# Patient Record
Sex: Female | Born: 1935
Health system: Southern US, Community
[De-identification: ages and names within clinical notes are randomized; demographics above are authoritative.]

## PROBLEM LIST (undated history)

## (undated) DIAGNOSIS — E785 Hyperlipidemia, unspecified: Secondary | ICD-10-CM

## (undated) DIAGNOSIS — M1990 Unspecified osteoarthritis, unspecified site: Secondary | ICD-10-CM

## (undated) DIAGNOSIS — Z972 Presence of dental prosthetic device (complete) (partial): Secondary | ICD-10-CM

## (undated) DIAGNOSIS — E119 Type 2 diabetes mellitus without complications: Secondary | ICD-10-CM

## (undated) DIAGNOSIS — K219 Gastro-esophageal reflux disease without esophagitis: Secondary | ICD-10-CM

## (undated) DIAGNOSIS — Z973 Presence of spectacles and contact lenses: Secondary | ICD-10-CM

## (undated) DIAGNOSIS — I10 Essential (primary) hypertension: Secondary | ICD-10-CM

## (undated) HISTORY — PX: ERCP: SHX60

## (undated) HISTORY — PX: HERNIA REPAIR: SHX51

## (undated) HISTORY — PX: CYST REMOVAL LEG: SHX6280

## (undated) HISTORY — PX: FLEXIBLE SIGMOIDOSCOPY: SHX1649

## (undated) HISTORY — PX: COLONOSCOPY: SHX174

## (undated) HISTORY — PX: TONSILLECTOMY: SUR1361

## (undated) HISTORY — PX: SHOULDER ARTHROSCOPY: SHX128

## (undated) HISTORY — PX: APPENDECTOMY: SHX54

---

## 1972-08-27 HISTORY — PX: ABDOMINAL HYSTERECTOMY: SHX81

## 2001-04-09 ENCOUNTER — Ambulatory Visit (HOSPITAL_COMMUNITY): Admission: RE | Admit: 2001-04-09 | Discharge: 2001-04-09 | Payer: Self-pay | Admitting: Internal Medicine

## 2001-04-09 ENCOUNTER — Encounter: Payer: Self-pay | Admitting: Internal Medicine

## 2001-08-27 HISTORY — PX: DIAGNOSTIC LAPAROSCOPY: SUR761

## 2001-10-15 ENCOUNTER — Ambulatory Visit (HOSPITAL_COMMUNITY): Admission: RE | Admit: 2001-10-15 | Discharge: 2001-10-15 | Payer: Self-pay | Admitting: Internal Medicine

## 2001-10-15 ENCOUNTER — Encounter: Payer: Self-pay | Admitting: Internal Medicine

## 2001-12-26 ENCOUNTER — Ambulatory Visit (HOSPITAL_COMMUNITY): Admission: RE | Admit: 2001-12-26 | Discharge: 2001-12-26 | Payer: Self-pay | Admitting: Internal Medicine

## 2001-12-26 ENCOUNTER — Encounter: Payer: Self-pay | Admitting: Internal Medicine

## 2002-01-22 ENCOUNTER — Ambulatory Visit (HOSPITAL_COMMUNITY): Admission: RE | Admit: 2002-01-22 | Discharge: 2002-01-22 | Payer: Self-pay | Admitting: Obstetrics & Gynecology

## 2002-05-21 ENCOUNTER — Emergency Department (HOSPITAL_COMMUNITY): Admission: EM | Admit: 2002-05-21 | Discharge: 2002-05-21 | Payer: Self-pay | Admitting: *Deleted

## 2002-05-21 ENCOUNTER — Encounter: Payer: Self-pay | Admitting: *Deleted

## 2002-10-19 ENCOUNTER — Encounter: Payer: Self-pay | Admitting: Internal Medicine

## 2002-10-19 ENCOUNTER — Ambulatory Visit (HOSPITAL_COMMUNITY): Admission: RE | Admit: 2002-10-19 | Discharge: 2002-10-19 | Payer: Self-pay | Admitting: Internal Medicine

## 2002-11-16 ENCOUNTER — Encounter: Payer: Self-pay | Admitting: Internal Medicine

## 2002-11-16 ENCOUNTER — Ambulatory Visit (HOSPITAL_COMMUNITY): Admission: RE | Admit: 2002-11-16 | Discharge: 2002-11-16 | Payer: Self-pay | Admitting: Internal Medicine

## 2002-12-30 ENCOUNTER — Ambulatory Visit (HOSPITAL_COMMUNITY): Admission: RE | Admit: 2002-12-30 | Discharge: 2002-12-30 | Payer: Self-pay | Admitting: Internal Medicine

## 2002-12-30 ENCOUNTER — Encounter: Payer: Self-pay | Admitting: Internal Medicine

## 2003-09-30 ENCOUNTER — Ambulatory Visit (HOSPITAL_COMMUNITY): Admission: RE | Admit: 2003-09-30 | Discharge: 2003-09-30 | Payer: Self-pay | Admitting: Internal Medicine

## 2003-10-25 ENCOUNTER — Ambulatory Visit (HOSPITAL_COMMUNITY): Admission: RE | Admit: 2003-10-25 | Discharge: 2003-10-25 | Payer: Self-pay | Admitting: Internal Medicine

## 2003-11-10 ENCOUNTER — Ambulatory Visit (HOSPITAL_COMMUNITY): Admission: RE | Admit: 2003-11-10 | Discharge: 2003-11-10 | Payer: Self-pay | Admitting: Internal Medicine

## 2004-06-06 ENCOUNTER — Emergency Department (HOSPITAL_COMMUNITY): Admission: EM | Admit: 2004-06-06 | Discharge: 2004-06-07 | Payer: Self-pay | Admitting: *Deleted

## 2004-11-17 ENCOUNTER — Ambulatory Visit (HOSPITAL_COMMUNITY): Admission: RE | Admit: 2004-11-17 | Discharge: 2004-11-17 | Payer: Self-pay | Admitting: Internal Medicine

## 2005-11-28 ENCOUNTER — Ambulatory Visit (HOSPITAL_COMMUNITY): Admission: RE | Admit: 2005-11-28 | Discharge: 2005-11-28 | Payer: Self-pay | Admitting: Internal Medicine

## 2006-03-24 ENCOUNTER — Emergency Department (HOSPITAL_COMMUNITY): Admission: EM | Admit: 2006-03-24 | Discharge: 2006-03-24 | Payer: Self-pay | Admitting: Emergency Medicine

## 2006-12-19 ENCOUNTER — Ambulatory Visit (HOSPITAL_COMMUNITY): Admission: RE | Admit: 2006-12-19 | Discharge: 2006-12-19 | Payer: Self-pay | Admitting: Internal Medicine

## 2007-02-21 ENCOUNTER — Ambulatory Visit (HOSPITAL_COMMUNITY): Admission: RE | Admit: 2007-02-21 | Discharge: 2007-02-21 | Payer: Self-pay | Admitting: Internal Medicine

## 2007-05-26 ENCOUNTER — Ambulatory Visit (HOSPITAL_COMMUNITY): Admission: RE | Admit: 2007-05-26 | Discharge: 2007-05-26 | Payer: Self-pay | Admitting: Internal Medicine

## 2007-08-28 HISTORY — PX: LUMBAR LAMINECTOMY: SHX95

## 2007-12-23 ENCOUNTER — Ambulatory Visit (HOSPITAL_COMMUNITY): Admission: RE | Admit: 2007-12-23 | Discharge: 2007-12-23 | Payer: Self-pay | Admitting: Internal Medicine

## 2008-04-12 ENCOUNTER — Ambulatory Visit (HOSPITAL_COMMUNITY): Admission: RE | Admit: 2008-04-12 | Discharge: 2008-04-12 | Payer: Self-pay | Admitting: Neurosurgery

## 2008-05-20 ENCOUNTER — Inpatient Hospital Stay (HOSPITAL_COMMUNITY): Admission: RE | Admit: 2008-05-20 | Discharge: 2008-05-22 | Payer: Self-pay | Admitting: Neurosurgery

## 2008-12-24 ENCOUNTER — Ambulatory Visit (HOSPITAL_COMMUNITY): Admission: RE | Admit: 2008-12-24 | Discharge: 2008-12-24 | Payer: Self-pay | Admitting: Internal Medicine

## 2009-02-05 ENCOUNTER — Emergency Department (HOSPITAL_COMMUNITY): Admission: EM | Admit: 2009-02-05 | Discharge: 2009-02-06 | Payer: Self-pay | Admitting: Emergency Medicine

## 2009-05-03 ENCOUNTER — Emergency Department (HOSPITAL_COMMUNITY): Admission: EM | Admit: 2009-05-03 | Discharge: 2009-05-03 | Payer: Self-pay | Admitting: Emergency Medicine

## 2009-12-26 ENCOUNTER — Ambulatory Visit (HOSPITAL_COMMUNITY): Admission: RE | Admit: 2009-12-26 | Discharge: 2009-12-26 | Payer: Self-pay | Admitting: Internal Medicine

## 2010-01-19 ENCOUNTER — Emergency Department (HOSPITAL_COMMUNITY): Admission: EM | Admit: 2010-01-19 | Discharge: 2010-01-19 | Payer: Self-pay | Admitting: Emergency Medicine

## 2010-11-13 LAB — POCT CARDIAC MARKERS
CKMB, poc: <1 ng/mL — ABNORMAL LOW (ref 1.0–8.0)
Myoglobin, poc: 94 ng/mL (ref 12–200)
Troponin i, poc: <0.05 ng/mL (ref 0.00–0.09)

## 2010-11-13 LAB — URINE CULTURE: Colony Count: 35000

## 2010-11-13 LAB — GLUCOSE, CAPILLARY: Glucose-Capillary: 134 mg/dL — ABNORMAL HIGH (ref 70–99)

## 2010-11-13 LAB — URINALYSIS, ROUTINE W REFLEX MICROSCOPIC
Bilirubin Urine: NEGATIVE
Glucose, UA: NEGATIVE mg/dL
Ketones, ur: NEGATIVE mg/dL
Nitrite: NEGATIVE
Protein, ur: NEGATIVE mg/dL
Specific Gravity, Urine: 1.03 (ref 1.005–1.030)
Urobilinogen, UA: 0.2 mg/dL (ref 0.0–1.0)
pH: 5.5 (ref 5.0–8.0)

## 2010-11-13 LAB — BASIC METABOLIC PANEL
BUN: 18 mg/dL (ref 6–23)
CO2: 31 mEq/L (ref 19–32)
Calcium: 9 mg/dL (ref 8.4–10.5)
Chloride: 103 mEq/L (ref 96–112)
Creatinine, Ser: 0.9 mg/dL (ref 0.4–1.2)
GFR calc Af Amer: >60 mL/min (ref >60)
GFR calc non Af Amer: >60 mL/min (ref >60)
Glucose, Bld: 150 mg/dL — ABNORMAL HIGH (ref 70–99)
Potassium: 3.8 mEq/L (ref 3.5–5.1)
Sodium: 140 mEq/L (ref 135–145)

## 2010-11-13 LAB — URINE MICROSCOPIC-ADD ON

## 2010-12-06 ENCOUNTER — Other Ambulatory Visit (HOSPITAL_COMMUNITY): Payer: Self-pay | Admitting: Internal Medicine

## 2010-12-06 DIAGNOSIS — Z139 Encounter for screening, unspecified: Secondary | ICD-10-CM

## 2010-12-09 ENCOUNTER — Emergency Department (HOSPITAL_COMMUNITY)
Admission: EM | Admit: 2010-12-09 | Discharge: 2010-12-09 | Disposition: A | Discharge status: Home / Self Care | Payer: Medicare Other | Attending: Emergency Medicine | Admitting: Emergency Medicine

## 2010-12-09 DIAGNOSIS — R630 Anorexia: Secondary | ICD-10-CM | POA: Insufficient documentation

## 2010-12-09 DIAGNOSIS — K2970 Gastritis, unspecified, without bleeding: Secondary | ICD-10-CM | POA: Insufficient documentation

## 2010-12-09 DIAGNOSIS — R1013 Epigastric pain: Secondary | ICD-10-CM | POA: Insufficient documentation

## 2010-12-09 DIAGNOSIS — R197 Diarrhea, unspecified: Secondary | ICD-10-CM | POA: Insufficient documentation

## 2010-12-09 DIAGNOSIS — Z79899 Other long term (current) drug therapy: Secondary | ICD-10-CM | POA: Insufficient documentation

## 2010-12-09 DIAGNOSIS — I10 Essential (primary) hypertension: Secondary | ICD-10-CM | POA: Insufficient documentation

## 2010-12-09 DIAGNOSIS — E785 Hyperlipidemia, unspecified: Secondary | ICD-10-CM | POA: Insufficient documentation

## 2010-12-09 DIAGNOSIS — E119 Type 2 diabetes mellitus without complications: Secondary | ICD-10-CM | POA: Insufficient documentation

## 2010-12-09 DIAGNOSIS — R110 Nausea: Secondary | ICD-10-CM | POA: Insufficient documentation

## 2010-12-09 LAB — URINALYSIS, ROUTINE W REFLEX MICROSCOPIC
Bilirubin Urine: NEGATIVE
Glucose, UA: NEGATIVE mg/dL
Hgb urine dipstick: NEGATIVE
Ketones, ur: NEGATIVE mg/dL
Nitrite: NEGATIVE
Protein, ur: NEGATIVE mg/dL
Specific Gravity, Urine: >1.03 — ABNORMAL HIGH (ref 1.005–1.030)
Urobilinogen, UA: 0.2 mg/dL (ref 0.0–1.0)
pH: 5 (ref 5.0–8.0)

## 2010-12-09 LAB — CBC
HCT: 41.6 % (ref 36.0–46.0)
Hemoglobin: 13.8 g/dL (ref 12.0–15.0)
MCH: 29.8 pg (ref 26.0–34.0)
MCHC: 33.2 g/dL (ref 30.0–36.0)
MCV: 89.8 fL (ref 78.0–100.0)
Platelets: 191 10*3/uL (ref 150–400)
RBC: 4.63 MIL/uL (ref 3.87–5.11)
RDW: 15.3 % (ref 11.5–15.5)
WBC: 6.6 10*3/uL (ref 4.0–10.5)

## 2010-12-09 LAB — COMPREHENSIVE METABOLIC PANEL
ALT: 23 U/L (ref 0–35)
AST: 30 U/L (ref 0–37)
Albumin: 3.3 g/dL — ABNORMAL LOW (ref 3.5–5.2)
Alkaline Phosphatase: 66 U/L (ref 39–117)
BUN: 18 mg/dL (ref 6–23)
CO2: 28 mEq/L (ref 19–32)
Calcium: 8.6 mg/dL (ref 8.4–10.5)
Chloride: 103 mEq/L (ref 96–112)
Creatinine, Ser: 0.93 mg/dL (ref 0.4–1.2)
GFR calc Af Amer: >60 mL/min (ref >60)
GFR calc non Af Amer: 59 mL/min — ABNORMAL LOW (ref >60)
Glucose, Bld: 140 mg/dL — ABNORMAL HIGH (ref 70–99)
Potassium: 3.5 mEq/L (ref 3.5–5.1)
Sodium: 139 mEq/L (ref 135–145)
Total Bilirubin: 0.6 mg/dL (ref 0.3–1.2)
Total Protein: 6.1 g/dL (ref 6.0–8.3)

## 2010-12-09 LAB — DIFFERENTIAL
Basophils Absolute: 0 10*3/uL (ref 0.0–0.1)
Basophils Relative: 0 % (ref 0–1)
Eosinophils Absolute: 0 10*3/uL (ref 0.0–0.7)
Eosinophils Relative: 1 % (ref 0–5)
Lymphocytes Relative: 13 % (ref 12–46)
Lymphs Abs: 0.9 10*3/uL (ref 0.7–4.0)
Monocytes Absolute: 0.4 10*3/uL (ref 0.1–1.0)
Monocytes Relative: 6 % (ref 3–12)
Neutro Abs: 5.3 10*3/uL (ref 1.7–7.7)
Neutrophils Relative %: 80 % — ABNORMAL HIGH (ref 43–77)

## 2010-12-09 LAB — TROPONIN I: Troponin I: 0.01 ng/mL (ref 0.00–0.06)

## 2010-12-09 LAB — LIPASE, BLOOD: Lipase: 22 U/L (ref 11–59)

## 2010-12-29 ENCOUNTER — Ambulatory Visit (HOSPITAL_COMMUNITY)
Admission: RE | Admit: 2010-12-29 | Discharge: 2010-12-29 | Disposition: A | Discharge status: Home / Self Care | Payer: Medicare Other | Source: Ambulatory Visit | Attending: Internal Medicine | Admitting: Internal Medicine

## 2010-12-29 DIAGNOSIS — Z1231 Encounter for screening mammogram for malignant neoplasm of breast: Secondary | ICD-10-CM | POA: Insufficient documentation

## 2010-12-29 DIAGNOSIS — Z139 Encounter for screening, unspecified: Secondary | ICD-10-CM

## 2011-01-09 NOTE — Op Note (Signed)
[name], [name]            ACCOUNT NO.:  [id number]   MEDICAL RECORD NO.:  [id number]          PATIENT TYPE:  CINP   LOCATION:                               FACILITY:  [facility]   PHYSICIAN:  Cristi Loron, M.D.DATE OF BIRTH:  May 25, 1935   DATE OF PROCEDURE:  DATE OF DISCHARGE:  05/22/2008                               OPERATIVE REPORT   BRIEF HISTORY:  The patient is a 76 year old black female who is  suffering from back and leg pain consistent with neurogenic  claudication.  She failed medical management, was worked up with a  lumbar MRI, MRA, CT which demonstrated the patient had spinal stenosis  at L3-4, L4-5.  I have discussed the various treatments with the patient  including surgery.  She has weighed the risks, benefits, and  alternatives, and decided to proceed with L3-4, L4-5 decompression.   PREOPERATIVE DIAGNOSES:  1. L3-4, L4-5 spinal stenosis.  2. Lumbar radiculopathy.  3. Lumbago.   POSTOPERATIVE DIAGNOSES:  1. L3-4, L4-5 spinal stenosis.  2. Lumbar radiculopathy.  3. Lumbago.   PROCEDURE:  L4 laminectomy with bilateral L3 laminotomies to decompress  the bilateral L3 and 4 nerve roots using microdissection.   SURGEON:  Cristi Loron, MD   ASSISTANT:  Coletta Memos, MD   ANESTHESIA:  General endotracheal.   ESTIMATED BLOOD LOSS:  50 mL.   SPECIMENS:  None.   DRAINS:  None.   COMPLICATIONS:  None.   PROCEDURE:  The patient was brought to the operating room by anesthesia  team.  General endotracheal anesthesia was induced.  The patient was  turned to the prone position on a Wilson frame.  Lumbosacral region was  then prepared with Betadine solution.  Sterile drapes were applied.  I  then injected the area to be incised with Marcaine with epinephrine  solution and used a scalpel to make a linear midline incision over the  L3-4, L4-5 interspace.  I used electrocautery to perform bilateral  subperiosteal dissection exposing spinous process of  lamina of L3, 4,  and 5.  We obtained intraoperative radiograph to confirm our location.   I inserted McCullough retractor for exposure and then used a scalpel to  incise the L3-4, and L4-5 interspinous ligament.  We used Leksell  rongeur to remove the L4 spinous process, caudal aspect of the L3  spinous process, and then used high-speed drill to perform bilateral L4  laminotomies and bilateral L3 laminotomies.  I completed the L4  laminectomy with Kerrison punch and widened the L3 laminotomies, we  removed the ligament of flavum at L3-4 and L4-5.  I then performed  foraminotomies about the bilateral L4-5 nerve roots removing excess  lateral ligament flavum from recesses.  We then inspected the L3-4, L4-5  intervertebral disk, they were bulging, but there was no herniations.  I  then palpated along the ventral surface of the thecal sac and along the  exit route of the L4 and 5 nerve roots and noted neural structure well  decompressed.  We then obtained hemostasis using bipolar cautery and  then irrigated well with bacitracin solution.  I then  removed the  retractor and then reapproximated the patient's thoracolumbar fascia  with interrupted #1 Vicryl suture, subcutaneous tissue with interrupted  2-0 Vicryl suture, and skin with Steri-Strips and Benzoin.  The wound  was then coated with bacitracin ointment.  Sterile dressing applied.  The drapes were removed.  The patient was subsequently returned to the  supine position where she was extubated by the anesthesia team and  transported to Post Anesthesia Care Unit in stable condition.  All  sponge, instrument, and needle counts were correct at the end of this  case.      Cristi Loron, M.D.  Electronically Signed     JDJ/MEDQ  D:  05/21/2008  T:  05/22/2008  Job:  [id number]

## 2011-01-09 NOTE — Discharge Summary (Signed)
[name], [name]            ACCOUNT NO.:  [id number]   MEDICAL RECORD NO.:  [id number]          PATIENT TYPE:  INP   LOCATION:  3013                         FACILITY:  [facility]   PHYSICIAN:  Stefani Dama, M.D.  DATE OF BIRTH:  1934/10/26   DATE OF ADMISSION:  05/20/2008  DATE OF DISCHARGE:  05/22/2008                               DISCHARGE SUMMARY   ADMITTING DIAGNOSIS:  Spinal stenosis, L3-L5, lumbar spine,  multifactorial.   DISCHARGE DIAGNOSIS:  Spinal stenosis, L3-L5, lumbar spine,  multifactorial.   SECONDARY DIAGNOSES:  1. Hypertension.  2. Diabetes mellitus.  3. Hypercholesterolemia.   OPERATIONS AND PROCEDURES:  Lumbar laminectomy L3-4, L4-5, and L5-S1 by  Dr. Tressie Stalker.   BRIEF HISTORY:  The patient is a 76 year old black female with  progressing difficulties with spinal stenosis, lumbar spine with pain,  inability to walk long distances, difficulty with standing, and  progressed having pain sitting and even lying down.  She has failed  conservative care and elects to proceed with surgical intervention to  decompress her spinal stenosis.   HOSPITAL COURSE:  The patient underwent lumbar laminectomy L3-S1 by Dr.  Lovell Sheehan on May 20, 2008, tolerated the procedure well, using  general anesthesia stabilized in recovery room, placed on the floor,  placed on appropriate postoperative pain medications, continued on her  home medications and worked with physical therapy and occupational  therapy for progressive ambulation, and was ready for discharge home on  May 22, 2008.  She was weaned off her IV pain medicine to p.o.  Percocet and Valium for muscle relaxation.  She was eating well and  voiding well.  Wound was benign.  Neurovascularly intact.  No focal  deficits.   DISCHARGE CONDITION:  Stable and improved.   DISCHARGE INSTRUCTIONS:  Discharged home.  Continue home medications.   1. Lipitor 40 mg one q.p.m.  2. Glipizide 5 mg q.a.m.  3.  Klor-Con 20 mEq q.a.m.  4. Actos 30 mg q.a.m.  5. Diovan 320 mg q.a.m.  6. Hydrochlorothiazide 25 mg q.a.m.  7. Amlodipine 5 mg q.p.m.  8. Aspirin  81 mg p.o. q.a.m.  9. Calcium Caltrate two daily.  10.Fish oil 1000 mg one daily.  11.Percocet 10/325 one p.o. q.4-6 h. p.r.n. pain.  12.Valium 5 mg one p.o. q.8 h. p.r.n. muscle spasm.   Follow up with Dr. Lovell Sheehan in 3-4 weeks.  Her wound dressing was  removed.  She may shower with soap and water.  No ointments.  Back  precautions; continue with progressive ambulation.  Discontinue IV prior  to discharge home.  Contact our office prior to follow up with any  question or concerns.      Aura Fey Bobbe Medico.      Stefani Dama, M.D.  Electronically Signed    SCI/MEDQ  D:  05/22/2008  T:  05/22/2008  Job:  [id number]

## 2011-01-12 NOTE — Op Note (Signed)
NAME:  [name], [name]                      ACCOUNT NO.:  [id number]   MEDICAL RECORD NO.:  [id number]                   PATIENT TYPE:  AMB   LOCATION:  DAY                                  FACILITY:  [facility]   PHYSICIAN:  Lionel December, M.D.                 DATE OF BIRTH:  May 24, 1935   DATE OF PROCEDURE:  DATE OF DISCHARGE:                                 OPERATIVE REPORT   PROCEDURE:  Esophagogastroduodenoscopy followed by a total colonoscopy.   ENDOSCOPIST:  Lionel December, M.D.   INDICATIONS:  This patient is a 76 year old African-American female with a  history of epigastric, right upper quadrant pain.  She had been on Mobic  which is discontinued and she has noted improvement.  She is also on low  dose ASA.  Her ultrasound and HIDA scan were unremarkable.  She is  undergoing diagnostic EGD followed by screening colonoscopy.  Her family  history is negative for colorectal carcinoma.   Procedure and risks were reviewed with the patient and informed consent was  obtained.   PREOPERATIVE MEDICATIONS:  Cetacaine spray for oropharyngeal topical  anesthesia, Demerol 25 mg IV and Versed 4 mg IV in divided dose.   FINDINGS:  Procedure performed in endoscopy suite.  The patient's vital  signs and O2 saturation were monitored during the procedure and remained  stable.   PROCEDURE #1: ESOPHAGOGASTRODUODENOSCOPY:  The patient was placed in the  left lateral recumbent position and Olympus videoscope was passed via the  oropharynx without any difficulty into the esophagus.   ESOPHAGUS:  Mucosa of the esophagus was normal throughout.  Squamocolumnar  junction was unremarkable and there was a 3 cm size sliding hiatal hernia.   STOMACH:  It had a large amount of bile in it, but there was no good debris.  Bile was suctioned out and mucosa examined.  The stomach distended very well  with insufflation.  The folds in the proximal stomach were normal.  Examination of the mucosa revealed a  small antral erosions.  Pyloric channel  was patent.  Angularis, fundus, and cardia were examined by retroflexing the  scope and were normal.   DUODENUM:  Examination of the bulb revealed patchy erythema and edema, but  no erosions or ulcers were noted.  Examination of the postbulbar duodenum  reveals normal mucosa and folds.   Endoscope was withdrawn and the patient was prepared for procedure #2.   COLONOSCOPY:  Rectal examination performed.  She had prominent external  hemorrhoids and erythema.  Digital exam was normal.   Olympus videoscope was placed in the rectum and advanced under vision into  the sigmoid colon and beyond.  Preparation was satisfactory.  She had a few  scattered diverticula throughout the colon.  The scope was passed into cecum  which was identified by appendiceal orifice and the ileocecal valve.  Pictures were taken for the record.  As the scope was withdrawn the colonic  mucosa was, once again, carefully examined and no polyps and/or tumor masses  noted.  Rectal mucosa was normal.   The scope was retroflexed to examine the anorectal junction and large  hemorrhoids were noted below the dentate line.  Endoscope was straightened  and withdrawn.  The patient tolerated the procedure well.   FINAL DIAGNOSES:  1. Small sliding hiatal hernia without endoscopic evidence of reflux     esophagitis.  2. Erosive antral gastritis with bulbar duodenitis.  3. Pancolonic diverticulosis.  4. Large external hemorrhoids with evidence of inflammation.   RECOMMENDATIONS:  1. She will continue antireflux measures and Prevacid 30 mg p.o. q.a.m. and     use Mobic only on a p.r.n. basis.  2. High fiber diet.  3. Citrucel or equivalent 1 tablespoonful daily.  4. Anusol HC cream to be applied to hemorrhoids b.i.d. for 10-14 days.  5. H. pylori serology will be checked today.      ___________________________________________                                            Lionel December, M.D.   NR/MEDQ  D:  11/10/2003  T:  11/10/2003  Job:  [id number]   cc:   Kingsley Callander. Ouida Sills, M.D.  [address]  [place]  [place] [address]  Fax: [phone]

## 2011-01-12 NOTE — H&P (Signed)
[facility]  Patient:    [name], [name] Visit Number: [id number] MRN: [id number]          Service Type: OUT Location: RAD Attending Physician:  Carylon Perches Dictated by:   Duane Lope, M.D. Admit Date:  12/26/2001 Discharge Date: 12/26/2001                           History and Physical  DATE OF BIRTH:  [birth year]  HISTORY OF PRESENT ILLNESS:  The patient is a 76 year old African American female, gravida 6, para 6, who is status post a TVH, who had been having increasing problems with right lower quadrant pain and right pelvic pain.  She underwent an abdominal sonogram which revealed multiple simple right ovarian cysts measuring up to 1.8 cm in diameter and also a right hydrosalpinx.  I obtained a CA-125 which was in the normal range.  She also has a history of having a right inguinal herniorrhaphy performed by Dr. Elpidio Anis and she did have some tenderness in this area.  I was concerned that this may be the source of her pain and had Dr. Katrinka Blazing see the patient and he did indeed evaluate her and said that her hernia repair indeed was intact, but she just had some superficial pain from the graft suturing with the abdominal wall in that area and felt like that this was not contributing to her pain.  As a result, after discussing the situation with the patient, we are proceeding with a laparoscopic bilateral salpingo-oophorectomy.  PAST MEDICAL HISTORY: 1. Diabetes. 2. Hypertension.  PAST SURGICAL HISTORY: 1. TVH in 1969. 2. Right shoulder arthroscopy. 3. Right inguinal herniorrhaphy. 4. Removal of lipoma. 5. Bunionectomy on the left foot.  PAST OBSTETRICAL HISTORY:  Six vaginal deliveries.  MEDICATIONS: 1. Hydrochlorothiazide 25 mg. 2. Diovan 32 mg. 3. Glucovance 25. 4. Premarin 0.625. 5. Lipitor 20 mg.  ALLERGIES:  PENICILLIN.  REVIEW OF SYSTEMS:  Otherwise negative.  FAMILY HISTORY:  Significant for hypertension, diabetes and  cancer.  PHYSICAL EXAMINATION:  VITAL SIGNS:  Weight is 154 pounds, blood pressure is 130/80.  HEENT:  Unremarkable.  NECK:  Thyroid is normal.  LUNGS:  Clear.  HEART:  Regular rate and rhythm without murmur, regurgitation or gallop.  BREASTS:  Without mass, discharge, skin changes.  ABDOMEN:  Benign; no hepatosplenomegaly, masses.  GENITALIA:  She has normal external genitalia.  Vagina is pink and moist without discharge.  Cuff is intact.  There are no midline masses.  She does have tenderness to palpation in both lower quadrants, right greater than left.  EXTREMITIES:  Warm with no edema.  NEUROLOGIC EXAM:  Grossly intact.  IMPRESSION: 1. Right lower quadrant and pelvic pain. 2. Right ovarian cyst and hydrosalpinx. 3. Normal CA-125.  PLAN:  The patient is admitted for a laparoscopic bilateral salpingo-oophorectomy.  The understands the risks, benefits, indications, alternatives and will proceed. Dictated by:   Duane Lope, M.D. Attending Physician:  Carylon Perches DD:  01/21/02 TD:  01/22/02 Job: [id number]

## 2011-01-12 NOTE — Op Note (Signed)
[facility]  Patient:    [name], [name] Visit Number: [id number] MRN: [id number]          Service Type: DSU Location: DAY Attending Physician:  Lazaro Arms Dictated by:   Turner Daniels, M.D. Proc. Date: 01/22/02 Admit Date:  01/22/2002                             Operative Report  PREOPERATIVE DIAGNOSES: 1. Right lower quadrant pain. 2. Right ovarian cyst. 3. Right hydrosalpinx. 4. Normal CA-125.  POSTOPERATIVE DIAGNOSES: 1. Right lower quadrant pain. 2. Right ovarian cyst. 3. Right hydrosalpinx. 4. Normal CA-125.  PROCEDURE:  Laparoscopic bilateral salpingo-oophorectomy.  SURGEON:  Turner Daniels, M.D.  ANESTHESIA:  General endotracheal.  FINDINGS:  The patients right ovary and tube were sort of multicystic, and hydrosalpinx was present.  It was densely adherent to the peritoneal surface and the pelvic side wall, but the ureter was well-identified.  The left ovary and tube was small, menopausal, normal, and basically free from the pelvic side wall.  The intraperitoneal contents were normal.  DESCRIPTION OF OPERATION:  The patient was taken to the operating room and placed in the supine position where she underwent general endotracheal anesthesia.  She was then placed in the dorsolithotomy position, prepped and draped in the usual sterile fashion.  A sponge stick was placed in the vagina for manipulation if needed.  0.5% Marcaine was injected in the infraumbilical and suprapubic incision sites.  Incisions were then made.  The abdomen was held up vigorously, and a pistol-grip plastic trocar was used under direct visualization and placed into the peritoneal cavity without difficulty.  The peritoneum was insufflated and the pelvis visualized. An incision was then made two fingerbreadths above the pubis, and a 5 mm trocar was placed into the peritoneal cavity under direct visualization without difficulty.  An incision was then made in the left  lower quadrant.  0.5% Marcaine with epinephrine had been injected previously.  A 5 mm trocar was placed into the peritoneal cavity without difficulty.  The right ovary was grasped.  The harmonic scalpel was used, and the infundibulopelvic ligament was coagulated, and the ovary was taken off the pelvic side wall and peritoneal surfaces.  It was then placed in the cul-de-sac.  There was good hemostasis.  The left ovary was then isolated and held.  The infundibulopelvic ligament on the left was coagulated, and the remaining pedicle was taken down with the harmonic scalpel.  There was a small amount of bleeding in the left infundibulopelvic pedicle, and this was taken care with the harmonic scalpel.  The pelvis was irrigated vigorously.  Good hemostasis was confirmed of all pedicles.  A 5 mm laparoscope was then placed in the left lower quadrant, and the Endocatch was placed.  Both specimens were placed into the Endocatch and removed from the peritoneal cavity without difficulty.  Again, all sites were visualized and found to be hemostatic.  The two 5 mm trocars were removed, and their sites were hemostatic in the intraperitoneal space.  The 2 mm trocar was then removed as well.  The umbilical fascia was closed using 0 Vicryl running. The skin was closed using 3-0 Vicryl in a subcuticular fashion.  The patient tolerated the procedure well.  She experienced minimal blood loss and was taken to the recovery room in good, stable condition.  All counts were correct x3. Dictated by:   Turner Daniels, M.D. Attending Physician:  Lazaro Arms DD:  01/22/02 TD:  01/23/02 Job: [id number]

## 2011-01-12 NOTE — Consult Note (Signed)
NAMEMARVENA, [name]                        ACCOUNT NO.:  [id number]   MEDICAL RECORD NO.:  [id number]                  PATIENT TYPE:   LOCATION:                                       FACILITY:  [facility]   PHYSICIAN:  Lionel December, M.D.                 DATE OF BIRTH:  12/27/1934   DATE OF CONSULTATION:  10/20/2003  DATE OF DISCHARGE:                                   CONSULTATION   GI CONSULT   REQUESTING PHYSICIAN:  Kingsley Callander. Ouida Sills, M.D.   REASON FOR CONSULTATION:  Right upper quadrant pain.   HISTORY OF PRESENT ILLNESS:  This patient is a 76 year old African-American  female who presents to our office with a 25-month history of intermittent  epigastric pain.  She notes that the pain is sharp and typically lasts a  couple of days intermittently.  She notes that the pain is worse  postprandially.  The pain is associated with nausea, as well as occasional  vomiting as well.  She denies any history of GERD or peptic ulcer disease  She has been on Prevacid for approximately 8 months now which has helped  some, although not completely with her symptoms.  Dr. Ouida Sills had ordered HIDA  scan on September 30, 2003 which was normal with an ejection fraction of 78.5.  She reports that she had recent abdominal ultrasound as well which was  negative for stones.  However, I do not have this report.  She reports her  bowel movements have been normal either daily or b.i.d. with an occasional  loose stool.  She denies any rectal bleeding or melena.  She has been on  Mobic for a month for her arthritis and has been on aspirin for 2 years now.  Reportedly she underwent a flexible sigmoidoscopy approximately 5 years ago.  She says that she did have a few polyps; however, I do not have records for  my review.  She is requesting screening colonoscopy today as well.   PAST MEDICAL HISTORY:  1. GERD.  2. Hypertension.  3. Diabetes mellitus diagnosed approximately 2-1/2 years ago.  4. Hypercholesterolemia.  5. Arthritis.   PAST SURGICAL HISTORY:  1. Partial hysterectomy 34 years ago followed by oophorectomy in May of 2003     for right ovarian cyst.  2. Right inguinal hernia repair.  3. Right shoulder rotator cuff repair.  4. Right leg benign cyst removal.   CURRENT MEDICATIONS:  1. Diovan 325 mg daily.  2. Metformin 1000 mg b.i.d.  3. Hydrochlorothiazide 25 mg daily.  4. Mobic 7.5 mg daily.  5. Glipizide 5 mg daily.  6. Lipitor 20 mg daily.  7. Aspirin 81 mg daily.  8. Calcium 600 mg with vitamin D b.i.d.  9. Prevacid 30 mg daily.   ALLERGIES:  PENICILLIN.   FAMILY HISTORY:  No known family history of colorectal carcinoma, liver, or  other chronic GI problems. Both mother, age 73,  and father, age 32, deceased  secondary to coronary artery disease and MI. She has 1 sister with breast  carcinoma and 2 brothers.  One with lung carcinoma.   SOCIAL HISTORY:  [name] is currently a widow and lives alone. She has 6  healthy grown children.  She is disabled secondary to her right shoulder  rotator cuff problems.  She denies any tobacco, alcohol or drug use.   REVIEW OF SYSTEMS:  CONSTITUTIONAL:  Weight is stable.  Denies any fever or  chills.  Appetite is good.  CARDIOVASCULAR:  She receives annual EKGs  through Dr. Alonza Smoker office which reportedly have all been normal.  She does  report occasional intermittent, sharp, left anterior chest pain which last  only a few seconds and is not accompanied by any diaphoresis, shortness of  breath, or nausea.  PULMONARY:  Denies any cough, hemoptysis, or shortness  of breath.  GYN:  She reports annual mammograms.  ENDOCRINE:  She notes her  blood sugars have been elevated lately.  She states that she feels that this  is because she has been under a lot of stress, most recently as high as 378.  Today, however, her numbers were back to normal.   PHYSICAL EXAMINATION:  VITAL SIGNS:  Weight 149 pounds.  Height 62.5 inches.  Temperature 98.1,  blood pressure 150/90, pulse 88.  GENERAL:  [name] is a well-developed, well-nourished, African-American  female in no acute distress.  She is alert, oriented, pleasant and  cooperative.  HEENT:  Sclerae are clear.  Nonicteric.  Conjunctivae pink. Oropharynx pink  and moist with upper and lower dentures intact.  No lesions.  NECK:  Supple without any masses or thyromegaly.  HEART:  Regular rate and rhythm with normal S1-S2 without any murmurs,  clicks, rubs or gallops.  LUNGS:  Clear to auscultation bilaterally.  ABDOMEN:  Flat with positive bowel sounds x4.  Soft, nontender,  nondistended.  No palpable mass or hepatosplenomegaly.  She does have well-  healed visible scars just below the umbilicus as well as bilateral lower  quadrant consistent with laparoscopic hysterectomy and oophorectomy.  Negative Murphy's sign.  EXTREMITIES:  2+ pedal pulses bilaterally.  No edema.  RECTAL:  Reveals a large, protruding, approximately 2-cm, round,  erythematous external hemorrhoid.  Good sphincter tone.  Internal exam  reveals a small amount of heme-positive light brown stool.   ASSESSMENT:  1. [name] is a 76 year old African-American female with persistent     epigastric pain of the colon.  I do not have any record from Dr. Alonza Smoker     office; however, the patient believes that she has not had any labs     drawn.  HIDA scan was negative and there was no evidence of biliary     dyskinesia and reportedly no evidence of cholelithiasis.  Right upper     quadrant pain most likely consistent with poorly controlled GERD.  She is     at risk for developed peptic ulcer disease as well given concomitant     aspirin and Mobic.  Would recommend further evaluation with EGD.  2. Large external hemorrhoid, will treat today and reevaluate at the time of     colonoscopy.  She is requesting screening colonoscopy today. 3. Fatty liver evident on HIDA scan; will check LFTs today as well.  4. Hypertension  and hyperglycemia; would ask that she recheck her blood     pressure while she is out and follow up with Dr.  Ouida Sills if it continues to     be elevated.  She is to continue her current medications and follow up     with Dr Alonza Smoker office for elevated blood sugars as well.   RECOMMENDATIONS:  1. Will schedule EGD for epigastric pain as well as screening colonoscopy to     be performed in the near future by Dr. Karilyn Cota.  I have discussed these     procedures including the risks and benefits which include, but are not     limited to, bleeding, perforation, infection and drug reaction with Ms.     [name].  She agrees with this plan.  Consent will be obtained.  She is     asked to hold her aspirin 3 days prior to the procedure.  I have asked     her to take half her dose of metformin the day prior to and of the     procedure as well.  2. Labs today to include LFTs, amylase, lipase, and CBC.  3. Will give hemorrhoid literature for her review and standard hemorrhoid     precautions.  4. Prescription was given for Anusol HC suppositories 1 b.i.d. per rectum     for 10 days.  5. Prescription was given for ProctoFoam to be used intermittently with     suppositories up to t.i.d.  6. Further recommendations pending procedure as aspirin and Mobic combined     may be exacerbating [name]'s symptoms and we may need to reevaluate,     pending procedure results.   We would like to thank Dr. Ouida Sills for allowing [place] to participate in the care  of [name].     ________________________________________  ___________________________________________  Nicholas Lose, N.P.                  Lionel December, M.D.   KC/MEDQ  D:  10/20/2003  T:  10/21/2003  Job:  [id number]   cc:   Kingsley Callander. Ouida Sills, M.D.  [address]  [place]  [place] [address]  Fax: [phone]   Lionel December, M.D.  [address]  [place]  [place] [address]  Fax: [phone]

## 2011-05-28 LAB — BASIC METABOLIC PANEL
BUN: 21
CO2: 27
Calcium: 9.2
Chloride: 106
Creatinine, Ser: 0.86
GFR calc Af Amer: >60
GFR calc non Af Amer: >60
Glucose, Bld: 114 — ABNORMAL HIGH
Potassium: 3.1 — ABNORMAL LOW
Sodium: 141

## 2011-05-28 LAB — GLUCOSE, CAPILLARY
Glucose-Capillary: 103 — ABNORMAL HIGH
Glucose-Capillary: 109 — ABNORMAL HIGH
Glucose-Capillary: 116 — ABNORMAL HIGH
Glucose-Capillary: 131 — ABNORMAL HIGH
Glucose-Capillary: 156 — ABNORMAL HIGH
Glucose-Capillary: 71
Glucose-Capillary: 75
Glucose-Capillary: 89
Glucose-Capillary: 94
Glucose-Capillary: 99

## 2011-05-28 LAB — CBC
HCT: 39.7
Hemoglobin: 13.6
MCHC: 34.2
MCV: 89.5
Platelets: 239
RBC: 4.43
RDW: 15.1
WBC: 4.9

## 2011-11-22 ENCOUNTER — Other Ambulatory Visit (HOSPITAL_COMMUNITY): Payer: Self-pay | Admitting: Internal Medicine

## 2011-11-22 DIAGNOSIS — Z139 Encounter for screening, unspecified: Secondary | ICD-10-CM

## 2011-12-31 ENCOUNTER — Ambulatory Visit (HOSPITAL_COMMUNITY)
Admission: RE | Admit: 2011-12-31 | Discharge: 2011-12-31 | Disposition: A | Discharge status: Home / Self Care | Payer: Medicare Other | Source: Ambulatory Visit | Attending: Internal Medicine | Admitting: Internal Medicine

## 2011-12-31 DIAGNOSIS — N63 Unspecified lump in unspecified breast: Secondary | ICD-10-CM | POA: Insufficient documentation

## 2011-12-31 DIAGNOSIS — Z1231 Encounter for screening mammogram for malignant neoplasm of breast: Secondary | ICD-10-CM | POA: Insufficient documentation

## 2011-12-31 DIAGNOSIS — Z139 Encounter for screening, unspecified: Secondary | ICD-10-CM

## 2012-01-03 ENCOUNTER — Other Ambulatory Visit: Payer: Self-pay | Admitting: Internal Medicine

## 2012-01-03 DIAGNOSIS — R928 Other abnormal and inconclusive findings on diagnostic imaging of breast: Secondary | ICD-10-CM

## 2012-01-09 ENCOUNTER — Ambulatory Visit (HOSPITAL_COMMUNITY)
Admission: RE | Admit: 2012-01-09 | Discharge: 2012-01-09 | Disposition: A | Discharge status: Home / Self Care | Payer: Medicare Other | Source: Ambulatory Visit | Attending: Internal Medicine | Admitting: Internal Medicine

## 2012-01-09 ENCOUNTER — Encounter (HOSPITAL_COMMUNITY): Payer: Medicare Other

## 2012-01-09 ENCOUNTER — Ambulatory Visit (HOSPITAL_COMMUNITY)
Admission: RE | Admit: 2012-01-09 | Discharge: 2012-01-09 | Payer: Medicare Other | Source: Ambulatory Visit | Attending: Internal Medicine | Admitting: Internal Medicine

## 2012-01-09 ENCOUNTER — Other Ambulatory Visit: Payer: Self-pay | Admitting: Internal Medicine

## 2012-01-09 DIAGNOSIS — R928 Other abnormal and inconclusive findings on diagnostic imaging of breast: Secondary | ICD-10-CM

## 2012-06-02 ENCOUNTER — Other Ambulatory Visit (HOSPITAL_COMMUNITY): Payer: Self-pay | Admitting: Internal Medicine

## 2012-06-02 DIAGNOSIS — Z139 Encounter for screening, unspecified: Secondary | ICD-10-CM

## 2012-07-14 ENCOUNTER — Ambulatory Visit (HOSPITAL_COMMUNITY): Payer: Medicare Other

## 2012-07-16 ENCOUNTER — Ambulatory Visit (HOSPITAL_COMMUNITY)
Admission: RE | Admit: 2012-07-16 | Discharge: 2012-07-16 | Disposition: A | Discharge status: Home / Self Care | Payer: Medicare Other | Source: Ambulatory Visit | Attending: Internal Medicine | Admitting: Internal Medicine

## 2012-07-16 DIAGNOSIS — R928 Other abnormal and inconclusive findings on diagnostic imaging of breast: Secondary | ICD-10-CM | POA: Insufficient documentation

## 2012-07-16 DIAGNOSIS — Z139 Encounter for screening, unspecified: Secondary | ICD-10-CM

## 2012-12-31 ENCOUNTER — Other Ambulatory Visit (HOSPITAL_COMMUNITY): Payer: Self-pay | Admitting: Internal Medicine

## 2012-12-31 DIAGNOSIS — Z09 Encounter for follow-up examination after completed treatment for conditions other than malignant neoplasm: Secondary | ICD-10-CM

## 2013-01-14 ENCOUNTER — Ambulatory Visit (HOSPITAL_COMMUNITY)
Admission: RE | Admit: 2013-01-14 | Discharge: 2013-01-14 | Disposition: A | Discharge status: Home / Self Care | Payer: Medicare Other | Source: Ambulatory Visit | Attending: Internal Medicine | Admitting: Internal Medicine

## 2013-01-14 DIAGNOSIS — Z09 Encounter for follow-up examination after completed treatment for conditions other than malignant neoplasm: Secondary | ICD-10-CM

## 2013-01-14 DIAGNOSIS — R928 Other abnormal and inconclusive findings on diagnostic imaging of breast: Secondary | ICD-10-CM | POA: Insufficient documentation

## 2013-06-10 ENCOUNTER — Other Ambulatory Visit (HOSPITAL_COMMUNITY): Payer: Self-pay | Admitting: Internal Medicine

## 2013-06-10 DIAGNOSIS — Z09 Encounter for follow-up examination after completed treatment for conditions other than malignant neoplasm: Secondary | ICD-10-CM

## 2013-07-22 ENCOUNTER — Ambulatory Visit (HOSPITAL_COMMUNITY)
Admission: RE | Admit: 2013-07-22 | Discharge: 2013-07-22 | Disposition: A | Discharge status: Home / Self Care | Payer: Medicare Other | Source: Ambulatory Visit | Attending: Internal Medicine | Admitting: Internal Medicine

## 2013-07-22 DIAGNOSIS — R928 Other abnormal and inconclusive findings on diagnostic imaging of breast: Secondary | ICD-10-CM | POA: Insufficient documentation

## 2013-07-22 DIAGNOSIS — Z09 Encounter for follow-up examination after completed treatment for conditions other than malignant neoplasm: Secondary | ICD-10-CM | POA: Insufficient documentation

## 2013-10-27 ENCOUNTER — Other Ambulatory Visit: Payer: Self-pay | Admitting: Orthopedic Surgery

## 2013-11-06 ENCOUNTER — Encounter (HOSPITAL_BASED_OUTPATIENT_CLINIC_OR_DEPARTMENT_OTHER): Payer: Self-pay | Admitting: *Deleted

## 2013-11-06 NOTE — Progress Notes (Signed)
To go to [facility] for bmet-[id number]-son to come dos-

## 2013-11-09 ENCOUNTER — Other Ambulatory Visit: Payer: Self-pay

## 2013-11-09 ENCOUNTER — Encounter (HOSPITAL_COMMUNITY)
Admission: RE | Admit: 2013-11-09 | Discharge: 2013-11-09 | Disposition: A | Discharge status: Home / Self Care | Payer: Medicare Other | Source: Ambulatory Visit | Attending: Orthopedic Surgery | Admitting: Orthopedic Surgery

## 2013-11-09 LAB — BASIC METABOLIC PANEL
BUN: 21 mg/dL (ref 6–23)
CO2: 33 mEq/L — ABNORMAL HIGH (ref 19–32)
Calcium: 9.4 mg/dL (ref 8.4–10.5)
Chloride: 102 mEq/L (ref 96–112)
Creatinine, Ser: 1 mg/dL (ref 0.50–1.10)
GFR calc Af Amer: 62 mL/min — ABNORMAL LOW (ref >90)
GFR calc non Af Amer: 53 mL/min — ABNORMAL LOW (ref >90)
Glucose, Bld: 141 mg/dL — ABNORMAL HIGH (ref 70–99)
Potassium: 3.8 mEq/L (ref 3.7–5.3)
Sodium: 144 mEq/L (ref 137–147)

## 2013-11-09 LAB — HEMOGLOBIN AND HEMATOCRIT, BLOOD
HCT: 41.8 % (ref 36.0–46.0)
Hemoglobin: 13.8 g/dL (ref 12.0–15.0)

## 2013-11-11 ENCOUNTER — Encounter (HOSPITAL_BASED_OUTPATIENT_CLINIC_OR_DEPARTMENT_OTHER): Payer: Self-pay | Admitting: *Deleted

## 2013-11-11 ENCOUNTER — Encounter (HOSPITAL_BASED_OUTPATIENT_CLINIC_OR_DEPARTMENT_OTHER): Payer: Medicare Other | Admitting: Anesthesiology

## 2013-11-11 ENCOUNTER — Encounter (HOSPITAL_BASED_OUTPATIENT_CLINIC_OR_DEPARTMENT_OTHER)
Admission: RE | Disposition: A | Discharge status: Home / Self Care | Payer: Self-pay | Source: Ambulatory Visit | Attending: Orthopedic Surgery

## 2013-11-11 ENCOUNTER — Ambulatory Visit (HOSPITAL_BASED_OUTPATIENT_CLINIC_OR_DEPARTMENT_OTHER): Payer: Medicare Other | Admitting: Anesthesiology

## 2013-11-11 ENCOUNTER — Ambulatory Visit (HOSPITAL_BASED_OUTPATIENT_CLINIC_OR_DEPARTMENT_OTHER)
Admission: RE | Admit: 2013-11-11 | Discharge: 2013-11-11 | Disposition: A | Discharge status: Home / Self Care | Payer: Medicare Other | Source: Ambulatory Visit | Attending: Orthopedic Surgery | Admitting: Orthopedic Surgery

## 2013-11-11 DIAGNOSIS — G5600 Carpal tunnel syndrome, unspecified upper limb: Secondary | ICD-10-CM | POA: Insufficient documentation

## 2013-11-11 DIAGNOSIS — Z01810 Encounter for preprocedural cardiovascular examination: Secondary | ICD-10-CM | POA: Insufficient documentation

## 2013-11-11 DIAGNOSIS — K219 Gastro-esophageal reflux disease without esophagitis: Secondary | ICD-10-CM | POA: Insufficient documentation

## 2013-11-11 DIAGNOSIS — Z01812 Encounter for preprocedural laboratory examination: Secondary | ICD-10-CM | POA: Insufficient documentation

## 2013-11-11 DIAGNOSIS — I10 Essential (primary) hypertension: Secondary | ICD-10-CM | POA: Insufficient documentation

## 2013-11-11 DIAGNOSIS — G5620 Lesion of ulnar nerve, unspecified upper limb: Secondary | ICD-10-CM | POA: Insufficient documentation

## 2013-11-11 DIAGNOSIS — E785 Hyperlipidemia, unspecified: Secondary | ICD-10-CM | POA: Insufficient documentation

## 2013-11-11 DIAGNOSIS — E119 Type 2 diabetes mellitus without complications: Secondary | ICD-10-CM | POA: Insufficient documentation

## 2013-11-11 HISTORY — PX: ULNAR NERVE TRANSPOSITION: SHX2595

## 2013-11-11 HISTORY — DX: Hyperlipidemia, unspecified: E78.5

## 2013-11-11 HISTORY — DX: Presence of dental prosthetic device (complete) (partial): Z97.2

## 2013-11-11 HISTORY — DX: Unspecified osteoarthritis, unspecified site: M19.90

## 2013-11-11 HISTORY — DX: Essential (primary) hypertension: I10

## 2013-11-11 HISTORY — DX: Type 2 diabetes mellitus without complications: E11.9

## 2013-11-11 HISTORY — DX: Gastro-esophageal reflux disease without esophagitis: K21.9

## 2013-11-11 HISTORY — DX: Presence of spectacles and contact lenses: Z97.3

## 2013-11-11 HISTORY — PX: CARPAL TUNNEL RELEASE: SHX101

## 2013-11-11 LAB — POCT HEMOGLOBIN-HEMACUE: Hemoglobin: 13.6 g/dL (ref 12.0–15.0)

## 2013-11-11 LAB — GLUCOSE, CAPILLARY
Glucose-Capillary: 130 mg/dL — ABNORMAL HIGH (ref 70–99)
Glucose-Capillary: 140 mg/dL — ABNORMAL HIGH (ref 70–99)

## 2013-11-11 SURGERY — CARPAL TUNNEL RELEASE
Anesthesia: General | Site: Elbow | Laterality: Right

## 2013-11-11 MED ORDER — OXYCODONE HCL 5 MG/5ML PO SOLN: 5.0000 mg | Freq: Once | ORAL | Status: DC | PRN

## 2013-11-11 MED ORDER — FENTANYL CITRATE 0.05 MG/ML IJ SOLN
INTRAMUSCULAR | Status: DC | PRN
  Administered 2013-11-11: 25 ug via INTRAVENOUS

## 2013-11-11 MED ORDER — ONDANSETRON HCL 4 MG/2ML IJ SOLN
INTRAMUSCULAR | Status: DC | PRN
  Administered 2013-11-11: 4 mg via INTRAVENOUS

## 2013-11-11 MED ORDER — MEPERIDINE HCL 25 MG/ML IJ SOLN: 6.2500 mg | INTRAMUSCULAR | Status: DC | PRN

## 2013-11-11 MED ORDER — LACTATED RINGERS IV SOLN
INTRAVENOUS | Status: DC
  Administered 2013-11-11 (×2): via INTRAVENOUS

## 2013-11-11 MED ORDER — MIDAZOLAM HCL 2 MG/2ML IJ SOLN
1.0000 mg | INTRAMUSCULAR | Status: DC | PRN
  Administered 2013-11-11: 1 mg via INTRAVENOUS

## 2013-11-11 MED ORDER — DEXAMETHASONE SODIUM PHOSPHATE 10 MG/ML IJ SOLN
INTRAMUSCULAR | Status: DC | PRN
  Administered 2013-11-11: 10 mg via INTRAVENOUS

## 2013-11-11 MED ORDER — CEFAZOLIN SODIUM-DEXTROSE 2-3 GM-% IV SOLR
2.0000 g | INTRAVENOUS | Status: AC
Start: 2013-11-11 — End: 2013-11-11
  Administered 2013-11-11: 2 g via INTRAVENOUS

## 2013-11-11 MED ORDER — FENTANYL CITRATE 0.05 MG/ML IJ SOLN
50.0000 ug | INTRAMUSCULAR | Status: DC | PRN
  Administered 2013-11-11: 50 ug via INTRAVENOUS

## 2013-11-11 MED ORDER — BUPIVACAINE HCL (PF) 0.25 % IJ SOLN
INTRAMUSCULAR | Status: AC
  Filled 2013-11-11: qty 60

## 2013-11-11 MED ORDER — HYDROCODONE-ACETAMINOPHEN 5-325 MG PO TABS: 1.0000 | ORAL_TABLET | Freq: Four times a day (QID) | ORAL | Status: DC | PRN

## 2013-11-11 MED ORDER — MIDAZOLAM HCL 2 MG/2ML IJ SOLN
INTRAMUSCULAR | Status: AC
  Filled 2013-11-11: qty 2

## 2013-11-11 MED ORDER — CHLORHEXIDINE GLUCONATE 4 % EX LIQD: 60.0000 mL | Freq: Once | CUTANEOUS | Status: DC

## 2013-11-11 MED ORDER — VANCOMYCIN HCL IN DEXTROSE 1-5 GM/200ML-% IV SOLN: 1000.0000 mg | INTRAVENOUS | Status: DC

## 2013-11-11 MED ORDER — ONDANSETRON HCL 4 MG/2ML IJ SOLN: 4.0000 mg | Freq: Once | INTRAMUSCULAR | Status: DC | PRN

## 2013-11-11 MED ORDER — HYDROMORPHONE HCL PF 1 MG/ML IJ SOLN: 0.2500 mg | INTRAMUSCULAR | Status: DC | PRN

## 2013-11-11 MED ORDER — PROPOFOL 10 MG/ML IV BOLUS
INTRAVENOUS | Status: DC | PRN
  Administered 2013-11-11: 150 mg via INTRAVENOUS

## 2013-11-11 MED ORDER — EPHEDRINE SULFATE 50 MG/ML IJ SOLN
INTRAMUSCULAR | Status: DC | PRN
  Administered 2013-11-11: 10 mg via INTRAVENOUS

## 2013-11-11 MED ORDER — BUPIVACAINE-EPINEPHRINE PF 0.5-1:200000 % IJ SOLN
INTRAMUSCULAR | Status: DC | PRN
  Administered 2013-11-11: 30 mL via PERINEURAL

## 2013-11-11 MED ORDER — FENTANYL CITRATE 0.05 MG/ML IJ SOLN
INTRAMUSCULAR | Status: AC
  Filled 2013-11-11: qty 6

## 2013-11-11 MED ORDER — OXYCODONE HCL 5 MG PO TABS: 5.0000 mg | ORAL_TABLET | Freq: Once | ORAL | Status: DC | PRN

## 2013-11-11 MED ORDER — FENTANYL CITRATE 0.05 MG/ML IJ SOLN
INTRAMUSCULAR | Status: AC
  Filled 2013-11-11: qty 2

## 2013-11-11 MED ORDER — LIDOCAINE HCL (CARDIAC) 20 MG/ML IV SOLN
INTRAVENOUS | Status: DC | PRN
  Administered 2013-11-11: 30 mg via INTRAVENOUS

## 2013-11-11 MED ORDER — CEFAZOLIN SODIUM-DEXTROSE 2-3 GM-% IV SOLR
INTRAVENOUS | Status: AC
  Filled 2013-11-11: qty 50

## 2013-11-11 SURGICAL SUPPLY — 53 items
BLADE MINI RND TIP GREEN BEAV (BLADE) IMPLANT
BLADE SURG 15 STRL LF DISP TIS (BLADE) ×2 IMPLANT
BLADE SURG 15 STRL SS (BLADE) ×3
BNDG CMPR 9X4 STRL LF SNTH (GAUZE/BANDAGES/DRESSINGS) ×2
BNDG COHESIVE 3X5 TAN STRL LF (GAUZE/BANDAGES/DRESSINGS) ×4 IMPLANT
BNDG ESMARK 4X9 LF (GAUZE/BANDAGES/DRESSINGS) ×1 IMPLANT
BNDG GAUZE ELAST 4 BULKY (GAUZE/BANDAGES/DRESSINGS) ×4 IMPLANT
CHLORAPREP W/TINT 26ML (MISCELLANEOUS) ×3 IMPLANT
CORDS BIPOLAR (ELECTRODE) ×3 IMPLANT
COVER MAYO STAND STRL (DRAPES) ×3 IMPLANT
COVER TABLE BACK 60X90 (DRAPES) ×3 IMPLANT
CUFF TOURNIQUET SINGLE 18IN (TOURNIQUET CUFF) ×3 IMPLANT
DECANTER SPIKE VIAL GLASS SM (MISCELLANEOUS) IMPLANT
DRAPE EXTREMITY T 121X128X90 (DRAPE) ×3 IMPLANT
DRAPE SURG 17X23 STRL (DRAPES) ×3 IMPLANT
DRSG KUZMA FLUFF (GAUZE/BANDAGES/DRESSINGS) IMPLANT
GAUZE SPONGE 4X4 16PLY XRAY LF (GAUZE/BANDAGES/DRESSINGS) IMPLANT
GAUZE XEROFORM 1X8 LF (GAUZE/BANDAGES/DRESSINGS) ×3 IMPLANT
GLOVE BIOGEL PI IND STRL 7.0 (GLOVE) IMPLANT
GLOVE BIOGEL PI IND STRL 8.5 (GLOVE) ×2 IMPLANT
GLOVE BIOGEL PI INDICATOR 7.0 (GLOVE) ×1
GLOVE BIOGEL PI INDICATOR 8.5 (GLOVE) ×1
GLOVE ECLIPSE 6.5 STRL STRAW (GLOVE) ×1 IMPLANT
GLOVE SURG ORTHO 8.0 STRL STRW (GLOVE) ×3 IMPLANT
GOWN STRL REUS W/ TWL LRG LVL3 (GOWN DISPOSABLE) ×2 IMPLANT
GOWN STRL REUS W/TWL LRG LVL3 (GOWN DISPOSABLE) ×3
GOWN STRL REUS W/TWL XL LVL3 (GOWN DISPOSABLE) ×3 IMPLANT
LOOP VESSEL MAXI BLUE (MISCELLANEOUS) IMPLANT
NDL SUT 6 .5 CRC .975X.05 MAYO (NEEDLE) IMPLANT
NEEDLE 27GAX1X1/2 (NEEDLE) IMPLANT
NEEDLE MAYO TAPER (NEEDLE)
NS IRRIG 1000ML POUR BTL (IV SOLUTION) ×3 IMPLANT
PACK BASIN DAY SURGERY FS (CUSTOM PROCEDURE TRAY) ×3 IMPLANT
PAD ABD 8X10 STRL (GAUZE/BANDAGES/DRESSINGS) ×3 IMPLANT
PAD CAST 3X4 CTTN HI CHSV (CAST SUPPLIES) ×2 IMPLANT
PAD CAST 4YDX4 CTTN HI CHSV (CAST SUPPLIES) ×2 IMPLANT
PADDING CAST ABS 4INX4YD NS (CAST SUPPLIES) ×1
PADDING CAST ABS COTTON 4X4 ST (CAST SUPPLIES) ×2 IMPLANT
PADDING CAST COTTON 3X4 STRL (CAST SUPPLIES) ×3
PADDING CAST COTTON 4X4 STRL (CAST SUPPLIES) ×3
SLEEVE SCD COMPRESS KNEE MED (MISCELLANEOUS) ×1 IMPLANT
SPLINT PLASTER CAST XFAST 3X15 (CAST SUPPLIES) IMPLANT
SPLINT PLASTER XTRA FASTSET 3X (CAST SUPPLIES)
SPONGE GAUZE 4X4 12PLY (GAUZE/BANDAGES/DRESSINGS) ×3 IMPLANT
STOCKINETTE 4X48 STRL (DRAPES) ×3 IMPLANT
SUT VIC AB 2-0 SH 27 (SUTURE) ×3
SUT VIC AB 2-0 SH 27XBRD (SUTURE) ×2 IMPLANT
SUT VICRYL 4-0 PS2 18IN ABS (SUTURE) IMPLANT
SUT VICRYL RAPIDE 4/0 PS 2 (SUTURE) ×3 IMPLANT
SYR BULB 3OZ (MISCELLANEOUS) ×3 IMPLANT
SYR CONTROL 10ML LL (SYRINGE) ×2 IMPLANT
TOWEL OR 17X24 6PK STRL BLUE (TOWEL DISPOSABLE) ×3 IMPLANT
UNDERPAD 30X30 INCONTINENT (UNDERPADS AND DIAPERS) ×3 IMPLANT

## 2013-11-11 NOTE — Discharge Instructions (Addendum)
[facility] Instructions °Hand Surgery ° °Wound Care: °Keep your hand elevated above the level of your heart.  Do not allow it to dangle by your side.  Keep the dressing dry and do not remove it unless your doctor advises you to do so.  He will usually change it at the time of your post-op visit.  Moving your fingers is advised to stimulate circulation but will depend on the site of your surgery.  If you have a splint applied, your doctor will advise you regarding movement. ° °Activity: °Do not drive or operate machinery today.  Rest today and then you may return to your normal activity and work as indicated by your physician. ° °Diet:  °Drink liquids today or eat a light diet.  You may resume a regular diet tomorrow.   ° °General expectations: °Pain for two to three days. °Fingers may become slightly swollen. ° °Call your doctor if any of the following occur: °Severe pain not relieved by pain medication. °Elevated temperature. °Dressing soaked with blood. °Inability to move fingers. °White or bluish color to fingers. ° ° °Post Anesthesia Home Care Instructions ° °Activity: °Get plenty of rest for the remainder of the day. A responsible adult should stay with you for 24 hours following the procedure.  °For the next 24 hours, DO NOT: °-Drive a car °-Operate machinery °-Drink alcoholic beverages °-Take any medication unless instructed by your physician °-Make any legal decisions or sign important papers. ° °Meals: °Start with liquid foods such as gelatin or soup. Progress to regular foods as tolerated. Avoid greasy, spicy, heavy foods. If nausea and/or vomiting occur, drink only clear liquids until the nausea and/or vomiting subsides. Call your physician if vomiting continues. ° °Special Instructions/Symptoms: °Your throat may feel dry or sore from the anesthesia or the breathing tube placed in your throat during surgery. If this causes discomfort, gargle with warm salt water. The discomfort should disappear within 24  hours. ° ° °Regional Anesthesia Blocks ° °1. Numbness or the inability to move the "blocked" extremity may last from 3-48 hours after placement. The length of time depends on the medication injected and your individual response to the medication. If the numbness is not going away after 48 hours, call your surgeon. ° °2. The extremity that is blocked will need to be protected until the numbness is gone and the  Strength has returned. Because you cannot feel it, you will need to take extra care to avoid injury. Because it may be weak, you may have difficulty moving it or using it. You may not know what position it is in without looking at it while the block is in effect. ° °3. For blocks in the legs and feet, returning to weight bearing and walking needs to be done carefully. You will need to wait until the numbness is entirely gone and the strength has returned. You should be able to move your leg and foot normally before you try and bear weight or walk. You will need someone to be with you when you first try to ensure you do not fall and possibly risk injury. ° °4. Bruising and tenderness at the needle site are common side effects and will resolve in a few days. ° °5. Persistent numbness or new problems with movement should be communicated to the surgeon or the [facility] ([phone])/ [facility] ([phone]). °

## 2013-11-11 NOTE — Anesthesia Procedure Notes (Addendum)
Anesthesia Regional Block:  Supraclavicular block  Pre-Anesthetic Checklist: ,, timeout performed, Correct Patient, Correct Site, Correct Laterality, Correct Procedure, Correct Position, site marked, Risks and benefits discussed,  Surgical consent,  Pre-op evaluation,  At surgeon's request and post-op pain management  Laterality: Right  Prep: chloraprep       Needles:   Needle Type: Echogenic Stimulator Needle     Needle Length: 9cm 9 cm Needle Gauge: 21 and 21 G    Additional Needles:  Procedures: ultrasound guided (picture in chart) and nerve stimulator Supraclavicular block  Nerve Stimulator or Paresthesia:  Response: 0.4 mA,   Additional Responses:   Narrative:  Start time: 11/11/2013 8:58 AM End time: 11/11/2013 9:10 AM Injection made incrementally with aspirations every 5 mL.  Performed by: Personally  Anesthesiologist: Arta BruceKevin Ossey MD  Additional Notes: Monitors applied. Patient sedated. Sterile prep and drape,hand hygiene and sterile gloves were used. Relevant anatomy identified.Needle position confirmed.Local anesthetic injected incrementally after negative aspiration. Local anesthetic spread visualized around nerve(s). Vascular puncture avoided. No complications. Image printed for medical record.The patient tolerated the procedure well.        Procedure Name: LMA Insertion Date/Time: 11/11/2013 10:29 AM Performed by: Casimir Barcellos Pre-anesthesia Checklist: Patient identified, Emergency Drugs available, Suction available and Patient being monitored Patient Re-evaluated:Patient Re-evaluated prior to inductionOxygen Delivery Method: Circle System Utilized Preoxygenation: Pre-oxygenation with 100% oxygen Intubation Type: IV induction Ventilation: Mask ventilation without difficulty LMA: LMA inserted LMA Size: 3.0 Number of attempts: 1 Airway Equipment and Method: bite block Placement Confirmation: positive ETCO2 Tube secured with: Tape Dental Injury: Teeth  and Oropharynx as per pre-operative assessment  Comments: Better fit and seal with 3 LMA

## 2013-11-11 NOTE — Progress Notes (Signed)
Assisted Dr. Ossey with right, ultrasound guided, supraclavicular block. Side rails up, monitors on throughout procedure. See vital signs in flow sheet. Tolerated Procedure well. 

## 2013-11-11 NOTE — H&P (Signed)
[name] is a 79 year-old right-hand dominant female with diabetes with bilateral hand pain.  This has been going on for approximately three weeks.  This awakens her at night with primarily pain.  She states this is to the dorsum of her hand and fingers and also in the radial aspect of her wrist.  She complains of intermittent aching type pain with a feeling of swelling.  She states it is getting worse.  Heat helps. She has been taking Advil occasionally.  She has history of diabetes, arthritis, no history of thyroid problems or gout. She has no history of injury to the hand or to the neck. She is awakened 7 out of 7 nights.  She has taken two Advil on a b.i.d. basis with minimal relief.  . She has had her nerve conductions done revealing a cubital tunnel syndrome with an amplitude diminution to 38.7, mild carpal tunnel syndrome both on the right side. Her median latencies are normal but her lumbrical interosseous difference is significantly diminished on her right side and normal on the left.   ALLERGIES:     Penicillin. MEDICATIONS:      Losartan, HCTZ, Actos, glipizide, Norvasc, Lipitor. SURGICAL HISTORY:     None. FAMILY MEDICAL HISTORY:    Positive for diabetes, heart disease, high blood pressure and arthritis.   SOCIAL HISTORY:      She does not smoke or drink. She is widowed, retired. REVIEW OF SYSTEMS:      Positive for glasses, high blood pressure, otherwise negative 14 points. [name] is an 79 y.o. female.   Chief Complaint: CTRS rt Cubital tunnel Right HPI: see above  Past Medical History  Diagnosis Date  . Hypertension   . Diabetes mellitus without complication   . Arthritis   . Wears glasses   . Hyperlipemia   . GERD (gastroesophageal reflux disease)   . Wears dentures     full top-partial bottom    Past Surgical History  Procedure Laterality Date  . Tonsillectomy    . Appendectomy    . Abdominal hysterectomy  1974  . Diagnostic laparoscopy  2003    BSO  .  Lumbar laminectomy  2009  . Flexible sigmoidoscopy    . Shoulder arthroscopy      rt  . Hernia repair      rt ing hernia  . Ercp    . Colonoscopy    . Cyst removal leg      rt    History reviewed. No pertinent family history. Social History:  reports that she has never smoked. She does not have any smokeless tobacco history on file. She reports that she does not drink alcohol or use illicit drugs.  Allergies:  Allergies  Allergen Reactions  . Penicillins Hives    Medications Prior to Admission  Medication Sig Dispense Refill  . amLODipine (NORVASC) 10 MG tablet Take 10 mg by mouth daily.      Marland Kitchen aspirin 81 MG tablet Take 81 mg by mouth daily.      Marland Kitchen atorvastatin (LIPITOR) 80 MG tablet Take 80 mg by mouth daily at 6 PM.      . glipiZIDE (GLUCOTROL) 5 MG tablet Take by mouth daily before breakfast.      . hydrochlorothiazide (HYDRODIURIL) 25 MG tablet Take 25 mg by mouth daily.      . pioglitazone (ACTOS) 30 MG tablet Take 30 mg by mouth daily.      . potassium chloride SA (K-DUR,KLOR-CON) 20 MEQ tablet  Take 20 mEq by mouth once.        Results for orders placed during the hospital encounter of 11/11/13 (from the past 48 hour(s))  GLUCOSE, CAPILLARY     Status: Abnormal   Collection Time    11/11/13  8:14 AM      Result Value Ref Range   Glucose-Capillary 130 (*) 70 - 99 mg/dL  POCT HEMOGLOBIN-HEMACUE     Status: None   Collection Time    11/11/13  8:15 AM      Result Value Ref Range   Hemoglobin 13.6  12.0 - 15.0 g/dL    No results found.   Pertinent items are noted in HPI.  Blood pressure 126/55, pulse 91, temperature 98 F (36.7 C), temperature source Oral, resp. rate 20, height 5\' 2"  (1.575 m), weight 178 lb 8 oz (80.967 kg), SpO2 100.00%.  General appearance: alert, cooperative and appears stated age Head: Normocephalic, without obvious abnormality Neck: no JVD Resp: clear to auscultation bilaterally Cardio: regular rate and rhythm, S1, S2 normal, no murmur,  click, rub or gallop GI: soft, non-tender; bowel sounds normal; no masses,  no organomegaly Extremities: extremities normal, atraumatic, no cyanosis or edema Pulses: 2+ and symmetric Skin: Skin color, texture, turgor normal. No rashes or lesions Neurologic: Grossly normal Incision/Wound: na  Assessment/Plan We have discussed the possibility of decompression, possible transposition of the ulnar nerve at the elbow with the possibility of carpal tunnel release. She is scheduled to see Dr. Ophelia CharterYates for her neck tomorrow. We would not recommend doing anything to the [facility]. The pre, peri and post op course are discussed along with risks and complications.  She is aware there is no guarantee with surgery, possibility of infection, recurrence, injury to arteries, nerves and tendons, incomplete relief of symptoms, possibility of transposition of the ulnar nerve with the necessity of a splint afterwards vs simple decompression and dystrophy.  She would like to proceed and this will be scheduled as an outpatient for decompression, possible transposition ulnar nerve right elbow and carpal tunnel release right hand as an outpatient under regional anesthesia.  Bennett Vanscyoc R 11/11/2013, 9:23 AM

## 2013-11-11 NOTE — Anesthesia Preprocedure Evaluation (Signed)
Anesthesia Evaluation  Patient identified by MRN, date of birth, ID band Patient awake    Reviewed: Allergy & Precautions, H&P , NPO status , Patient's Chart, lab work & pertinent test results  Airway Mallampati: I TM Distance: >3 FB Neck ROM: Full    Dental   Pulmonary          Cardiovascular hypertension, Pt. on medications     Neuro/Psych    GI/Hepatic   Endo/Other  diabetes  Renal/GU      Musculoskeletal   Abdominal   Peds  Hematology   Anesthesia Other Findings   Reproductive/Obstetrics                           Anesthesia Physical Anesthesia Plan  ASA: II  Anesthesia Plan: General   Post-op Pain Management:    Induction: Intravenous  Airway Management Planned: LMA  Additional Equipment:   Intra-op Plan:   Post-operative Plan: Extubation in OR  Informed Consent: I have reviewed the patients History and Physical, chart, labs and discussed the procedure including the risks, benefits and alternatives for the proposed anesthesia with the patient or authorized representative who has indicated his/her understanding and acceptance.     Plan Discussed with: CRNA and Surgeon  Anesthesia Plan Comments:         Anesthesia Quick Evaluation  

## 2013-11-11 NOTE — Brief Op Note (Signed)
11/11/2013  11:17 AM  PATIENT:  [name]  79 y.o. female  PRE-OPERATIVE DIAGNOSIS:  RIGHT CUBITAL TUNNEL/RIGHT CARPAL TUNNEL   POST-OPERATIVE DIAGNOSIS:  RIGHT CUBITAL TUNNEL/RIGHT CARPAL TUNNEL   PROCEDURE:  Procedure(s): RIGHT CARPAL TUNNEL RELEASE (Right) DECOMPRESSION, ULNAR NERVE RIGHT ELBOW (Right)  SURGEON:  Surgeon(s) and Role:    * Nicki ReaperGary R Gerilynn Mccullars, MD - Primary  PHYSICIAN ASSISTANT:   ASSISTANTS: none   ANESTHESIA:   regional and general  EBL:  Total I/O In: 1000 [I.V.:1000] Out: -   BLOOD ADMINISTERED:none  DRAINS: none   LOCAL MEDICATIONS USED:  NONE  SPECIMEN:  No Specimen  DISPOSITION OF SPECIMEN:  N/A  COUNTS:  YES  TOURNIQUET:   Total Tourniquet Time Documented: Upper Arm (Right) - 29 minutes Total: Upper Arm (Right) - 29 minutes   DICTATION: .Other Dictation: Dictation Number [id number]   PLAN OF CARE: Discharge to home after PACU  PATIENT DISPOSITION:  PACU - hemodynamically stable.

## 2013-11-11 NOTE — Op Note (Signed)
NAMClarita [name]:  Donate, Areen            ACCOUNT NO.:  [id number]  MEDICAL RECORD NO.:  [id number]  LOCATION:                                 FACILITY:  PHYSICIAN:  Cindee SaltGary Bridgid Printz, M.D.       DATE OF BIRTH:  1934-08-29  DATE OF PROCEDURE:  11/11/2013 DATE OF DISCHARGE:                              OPERATIVE REPORT   PREOPERATIVE DIAGNOSES:  Carpal tunnel syndrome, right hand; cubital tunnel syndrome, right elbow.  POSTOPERATIVE DIAGNOSES:  Carpal tunnel syndrome, right hand; cubital tunnel syndrome, right elbow.  OPERATION:  Decompression of median nerve, right wrist, decompression of ulnar nerve, right elbow.  SURGEON:  Cindee SaltGary Marx Doig, M.D.  ANESTHESIA:  Supraclavicular block with sedation.  ANESTHESIOLOGIST:  Dr. Jean RosenthalJackson.  HISTORY:  The patient is a 79 year old female with a history of carpal tunnel syndrome, cubital tunnel syndrome, nerve conduction is positive for each.  She has elected to undergo surgical decompression, being aware of risks and complications including infection; recurrence of injury to arteries, nerves, tendons, incomplete relief of symptoms, dystrophy.  In the preoperative area, the patient is seen, the extremity marked by both patient and surgeon.  Antibiotic given.  PROCEDURE IN DETAIL:  The patient was brought to the operating room, where a supraclavicular block general anesthetic were carried out without difficulty under the direction of Dr. Jean RosenthalJackson.  She was prepped using ChloraPrep, supine position, right arm free.  A 3-minute dry time was allowed.  Time-out taken, confirming the patient and procedure.  The limb was exsanguinated with an Esmarch bandage.  Tourniquet placed high on the arm was inflated to 250 mmHg.  A straight incision was made longitudinally in the right palm, carried down through subcutaneous tissue.  Bleeders were electrocauterized.  Palmar fascia was split. Superficial palmar arch identified.  The flexor tendon to the ring little finger  identified to the ulnar side of median nerve.  The carpal retinaculum was incised with sharp dissection.  Right angle and Sewall retractor were placed between skin and forearm fascia.  The fascia was released for approximately a 1.5 to 2 under direct vision.  The canal was explored.  Compression to the nerve was apparent.  Motor branch entered into muscle.  No further lesions were identified.  The wound was irrigated and the skin closed with 4-0 Vicryl Rapide sutures.  A separate incision was then made over the medial epicondyle of the right elbow, carried down through subcutaneous tissue.  Bleeders again electrocauterized with bipolar.  Dissection carried down to Osborne fascia in posterior aspect.  This was incised exposing the ulnar nerve at the elbow.  The flexor carpi ulnaris fascia was then separated from the overlying subcutaneous tissue.  Two knee retractors were placed.  A fasciotomy of the flexor carpi ulnaris was then proceeded, the muscle split with blunt dissection.  The deep fascia was then released over the entire course of the nerve for approximately 7 cm distally by placing a KMI carpal tunnel release guide between the ulnar nerve and the deep fascia.  This was then released with an angled ENT scissors.  Attention was then directed proximally.  The subcutaneous tissue separated from the brachial fascia.  The [facility] guide was then  placed between the ulnar nerve, and the fascia released proximally using the ENT angled scissors along with the retraction by knee retractors for approximately 7 cm proximally up to the arcade of [place].  The elbow was then fully flexed.  The nerve did not dislocate.  The wound was copiously irrigated with saline.  Subcutaneous tissue was closed with interrupted 2-0 Vicryl and the skin with interrupted 4-0 Vicryl Rapide sterile compressive dressing was applied to the wrist and elbow.  Deflation of the tourniquet, all fingers immediately pinked.   She was taken to the recovery room for observation in satisfactory condition.  She will be discharged home to return in 1 week to the [facility] of [place] on [place].          ______________________________ Cindee Salt, M.D.     GK/MEDQ  D:  11/11/2013  T:  11/11/2013  Job:  [id number]

## 2013-11-11 NOTE — Op Note (Signed)
Dictation Number [id number]

## 2013-11-11 NOTE — Transfer of Care (Signed)
Immediate Anesthesia Transfer of Care Note  Patient: [name] MilesElizabeth A [name]  Procedure(s) Performed: Procedure(s): RIGHT CARPAL TUNNEL RELEASE (Right) DECOMPRESSION, ULNAR NERVE RIGHT ELBOW (Right)  Patient Location: PACU  Anesthesia Type:GA combined with regional for post-op pain  Level of Consciousness: awake, alert , oriented and patient cooperative  Airway & Oxygen Therapy: Patient Spontanous Breathing and Patient connected to face mask oxygen  Post-op Assessment: Report given to PACU RN and Post -op Vital signs reviewed and stable  Post vital signs: Reviewed and stable  Complications: No apparent anesthesia complications

## 2013-11-11 NOTE — Anesthesia Postprocedure Evaluation (Signed)
Anesthesia Post Note  Patient: [name] MilesElizabeth A [name]  Procedure(s) Performed: Procedure(s) (LRB): RIGHT CARPAL TUNNEL RELEASE (Right) DECOMPRESSION, ULNAR NERVE RIGHT ELBOW (Right)  Anesthesia type: general  Patient location: PACU  Post pain: Pain level controlled  Post assessment: Patient's Cardiovascular Status Stable  Last Vitals:  Filed Vitals:   11/11/13 1200  BP: 129/72  Pulse: 93  Temp: 36.4 C  Resp: 16    Post vital signs: Reviewed and stable  Level of consciousness: sedated  Complications: No apparent anesthesia complications

## 2013-11-12 ENCOUNTER — Encounter (HOSPITAL_BASED_OUTPATIENT_CLINIC_OR_DEPARTMENT_OTHER): Payer: Self-pay | Admitting: Orthopedic Surgery

## 2013-12-04 ENCOUNTER — Other Ambulatory Visit (HOSPITAL_COMMUNITY): Payer: Self-pay | Admitting: Internal Medicine

## 2013-12-04 DIAGNOSIS — N63 Unspecified lump in unspecified breast: Secondary | ICD-10-CM

## 2013-12-04 DIAGNOSIS — Z09 Encounter for follow-up examination after completed treatment for conditions other than malignant neoplasm: Secondary | ICD-10-CM

## 2014-01-20 ENCOUNTER — Encounter (HOSPITAL_COMMUNITY): Payer: Medicare Other

## 2014-01-20 ENCOUNTER — Ambulatory Visit (HOSPITAL_COMMUNITY)
Admission: RE | Admit: 2014-01-20 | Discharge: 2014-01-20 | Disposition: A | Discharge status: Home / Self Care | Payer: Medicare Other | Source: Ambulatory Visit | Attending: Internal Medicine | Admitting: Internal Medicine

## 2014-01-20 DIAGNOSIS — R928 Other abnormal and inconclusive findings on diagnostic imaging of breast: Secondary | ICD-10-CM | POA: Diagnosis present

## 2014-01-20 DIAGNOSIS — N63 Unspecified lump in unspecified breast: Secondary | ICD-10-CM

## 2014-01-20 DIAGNOSIS — Z09 Encounter for follow-up examination after completed treatment for conditions other than malignant neoplasm: Secondary | ICD-10-CM

## 2014-03-31 ENCOUNTER — Encounter (HOSPITAL_COMMUNITY): Payer: Self-pay | Admitting: Emergency Medicine

## 2014-03-31 ENCOUNTER — Emergency Department (HOSPITAL_COMMUNITY)
Admission: EM | Admit: 2014-03-31 | Discharge: 2014-03-31 | Disposition: A | Discharge status: Home / Self Care | Payer: Medicare Other | Attending: Emergency Medicine | Admitting: Emergency Medicine

## 2014-03-31 ENCOUNTER — Emergency Department (HOSPITAL_COMMUNITY): Payer: Medicare Other

## 2014-03-31 DIAGNOSIS — E785 Hyperlipidemia, unspecified: Secondary | ICD-10-CM | POA: Insufficient documentation

## 2014-03-31 DIAGNOSIS — S8001XA Contusion of right knee, initial encounter: Secondary | ICD-10-CM

## 2014-03-31 DIAGNOSIS — S8990XA Unspecified injury of unspecified lower leg, initial encounter: Secondary | ICD-10-CM | POA: Diagnosis present

## 2014-03-31 DIAGNOSIS — S8000XA Contusion of unspecified knee, initial encounter: Secondary | ICD-10-CM | POA: Insufficient documentation

## 2014-03-31 DIAGNOSIS — I10 Essential (primary) hypertension: Secondary | ICD-10-CM | POA: Insufficient documentation

## 2014-03-31 DIAGNOSIS — Z8719 Personal history of other diseases of the digestive system: Secondary | ICD-10-CM | POA: Diagnosis not present

## 2014-03-31 DIAGNOSIS — Z23 Encounter for immunization: Secondary | ICD-10-CM | POA: Diagnosis not present

## 2014-03-31 DIAGNOSIS — Z7982 Long term (current) use of aspirin: Secondary | ICD-10-CM | POA: Insufficient documentation

## 2014-03-31 DIAGNOSIS — E119 Type 2 diabetes mellitus without complications: Secondary | ICD-10-CM | POA: Diagnosis not present

## 2014-03-31 DIAGNOSIS — Z79899 Other long term (current) drug therapy: Secondary | ICD-10-CM | POA: Insufficient documentation

## 2014-03-31 DIAGNOSIS — Y9289 Other specified places as the place of occurrence of the external cause: Secondary | ICD-10-CM | POA: Diagnosis not present

## 2014-03-31 DIAGNOSIS — M129 Arthropathy, unspecified: Secondary | ICD-10-CM | POA: Diagnosis not present

## 2014-03-31 DIAGNOSIS — W1809XA Striking against other object with subsequent fall, initial encounter: Secondary | ICD-10-CM | POA: Diagnosis not present

## 2014-03-31 DIAGNOSIS — Z880 Allergy status to penicillin: Secondary | ICD-10-CM | POA: Insufficient documentation

## 2014-03-31 DIAGNOSIS — S99919A Unspecified injury of unspecified ankle, initial encounter: Secondary | ICD-10-CM

## 2014-03-31 DIAGNOSIS — S99929A Unspecified injury of unspecified foot, initial encounter: Secondary | ICD-10-CM

## 2014-03-31 DIAGNOSIS — Y9301 Activity, walking, marching and hiking: Secondary | ICD-10-CM | POA: Diagnosis not present

## 2014-03-31 MED ORDER — DIPHTH-ACELL PERTUSSIS-TETANUS 25-58-10 LF-MCG/0.5 IM SUSP
0.5000 mL | Freq: Once | INTRAMUSCULAR | Status: AC
  Administered 2014-03-31: 0.5 mL via INTRAMUSCULAR
  Filled 2014-03-31: qty 0.5

## 2014-03-31 NOTE — ED Notes (Signed)
Pt states she fell on cement coming out of her door.

## 2014-03-31 NOTE — Discharge Instructions (Signed)
Tylenol or motrin for pain.  Follow up as needed °

## 2014-03-31 NOTE — ED Provider Notes (Signed)
CSN: [id number]     Arrival date & time 03/31/14  1056 History  This chart was scribed for [name] LennertJoseph L Jedrick Hutcherson, MD by Phillis HaggisGabriella Gaje, ED Scribe. This patient was seen in room APA16A/APA16A and patient care was started at 11:21 AM.   Chief Complaint  Patient presents with  . Knee Pain   Patient is a 79 y.o. female presenting with knee pain. The history is provided by the patient. No language interpreter was used.  Knee Pain Location:  Knee Time since incident:  30 minutes Knee location:  R knee Pain details:    Severity:  Mild   Timing:  Constant   Progression:  Improving Chronicity:  New Tetanus status:  Out of date Ineffective treatments:  None tried Associated symptoms: no back pain and no fatigue    HPI Comments: [name] is a 79 y.o. female who presents to the Emergency Department complaining of right knee pain onset PTA. She states that she was walking when she fell out the door and hit the concrete. She reports mild pain to her left ankle. She denies an UTD tdap.   Past Medical History  Diagnosis Date  . Hypertension   . Diabetes mellitus without complication   . Arthritis   . Wears glasses   . Hyperlipemia   . GERD (gastroesophageal reflux disease)   . Wears dentures     full top-partial bottom   Past Surgical History  Procedure Laterality Date  . Tonsillectomy    . Appendectomy    . Abdominal hysterectomy  1974  . Diagnostic laparoscopy  2003    BSO  . Lumbar laminectomy  2009  . Flexible sigmoidoscopy    . Shoulder arthroscopy      rt  . Hernia repair      rt ing hernia  . Ercp    . Colonoscopy    . Cyst removal leg      rt  . Carpal tunnel release Right 11/11/2013    Procedure: RIGHT CARPAL TUNNEL RELEASE;  Surgeon: Nicki ReaperGary R Kuzma, MD;  Location: [facility];  Service: Orthopedics;  Laterality: Right;  . Ulnar nerve transposition Right 11/11/2013    Procedure: DECOMPRESSION, ULNAR NERVE RIGHT ELBOW;  Surgeon: Nicki ReaperGary R Kuzma, MD;  Location:  [facility];  Service: Orthopedics;  Laterality: Right;   No family history on file. History  Substance Use Topics  . Smoking status: Never Smoker   . Smokeless tobacco: Not on file  . Alcohol Use: No   OB History   Grav Para Term Preterm Abortions TAB SAB Ect Mult Living                 Review of Systems  Constitutional: Negative for appetite change and fatigue.  HENT: Negative for congestion, ear discharge and sinus pressure.   Eyes: Negative for discharge.  Respiratory: Negative for cough.   Cardiovascular: Negative for chest pain.  Gastrointestinal: Negative for abdominal pain and diarrhea.  Genitourinary: Negative for frequency and hematuria.  Musculoskeletal: Positive for arthralgias. Negative for back pain.  Skin: Negative for rash.  Neurological: Negative for seizures and headaches.  Psychiatric/Behavioral: Negative for hallucinations.   Allergies  Penicillins  Home Medications   Prior to Admission medications   Medication Sig Start Date End Date Taking? Authorizing Provider  amLODipine (NORVASC) 10 MG tablet Take 10 mg by mouth daily.    Historical Provider, MD  aspirin 81 MG tablet Take 81 mg by mouth daily.  Historical Provider, MD  atorvastatin (LIPITOR) 80 MG tablet Take 80 mg by mouth daily at 6 PM.    Historical Provider, MD  glipiZIDE (GLUCOTROL) 5 MG tablet Take by mouth daily before breakfast.    Historical Provider, MD  hydrochlorothiazide (HYDRODIURIL) 25 MG tablet Take 25 mg by mouth daily.    Historical Provider, MD  HYDROcodone-acetaminophen (NORCO) 5-325 MG per tablet Take 1 tablet by mouth every 6 (six) hours as needed for moderate pain. 11/11/13   Cindee Salt, MD  pioglitazone (ACTOS) 30 MG tablet Take 30 mg by mouth daily.    Historical Provider, MD  potassium chloride SA (K-DUR,KLOR-CON) 20 MEQ tablet Take 20 mEq by mouth once.    Historical Provider, MD   BP 145/79  Pulse 88  Temp(Src) 98.7 F (37.1 C) (Oral)  Resp 20  Ht 5'  2" (1.575 m)  Wt 173 lb (78.472 kg)  BMI 31.63 kg/m2  SpO2 99%  Physical Exam  Constitutional: She is oriented to person, place, and time. She appears well-developed.  HENT:  Head: Normocephalic.  Eyes: Conjunctivae are normal.  Neck: No tracheal deviation present.  Cardiovascular:  No murmur heard. Musculoskeletal: Normal range of motion.  Mild swelling and abrasion to right knee.   Neurological: She is oriented to person, place, and time.  Skin: Skin is warm.  Psychiatric: She has a normal mood and affect.    ED Course  Procedures (including critical care time) DIAGNOSTIC STUDIES: Oxygen Saturation is 99% on room air, normal by my interpretation.    COORDINATION OF CARE: 11:23 AM-Discussed treatment plan which includes x-rays with pt at bedside and pt agreed to plan.   Labs Review Labs Reviewed - No data to display  Imaging Review Dg Knee Complete 4 Views Right  03/31/2014   CLINICAL DATA:  Fall  EXAM: RIGHT KNEE - COMPLETE 4+ VIEW  COMPARISON:  None.  FINDINGS: No acute fracture.  No dislocation.  Degenerative changes.  IMPRESSION: No acute bony pathology.   Electronically Signed   By: Maryclare Bean M.D.   On: 03/31/2014 12:02     EKG Interpretation None      MDM   Final diagnoses:  None   The chart was scribed for me under my direct supervision.  I personally performed the history, physical, and medical decision making and all procedures in the evaluation of this patient.[name] Lennert, MD 03/31/14 [phone]

## 2014-10-06 DIAGNOSIS — E119 Type 2 diabetes mellitus without complications: Secondary | ICD-10-CM | POA: Diagnosis not present

## 2014-10-13 DIAGNOSIS — E1129 Type 2 diabetes mellitus with other diabetic kidney complication: Secondary | ICD-10-CM | POA: Diagnosis not present

## 2014-10-13 DIAGNOSIS — I10 Essential (primary) hypertension: Secondary | ICD-10-CM | POA: Diagnosis not present

## 2014-12-22 ENCOUNTER — Other Ambulatory Visit (HOSPITAL_COMMUNITY): Payer: Self-pay | Admitting: Internal Medicine

## 2014-12-22 DIAGNOSIS — Z09 Encounter for follow-up examination after completed treatment for conditions other than malignant neoplasm: Secondary | ICD-10-CM

## 2015-01-26 ENCOUNTER — Ambulatory Visit (HOSPITAL_COMMUNITY)
Admission: RE | Admit: 2015-01-26 | Discharge: 2015-01-26 | Disposition: A | Discharge status: Home / Self Care | Payer: Medicare Other | Source: Ambulatory Visit | Attending: Internal Medicine | Admitting: Internal Medicine

## 2015-01-26 DIAGNOSIS — Z1231 Encounter for screening mammogram for malignant neoplasm of breast: Secondary | ICD-10-CM | POA: Insufficient documentation

## 2015-01-26 DIAGNOSIS — Z09 Encounter for follow-up examination after completed treatment for conditions other than malignant neoplasm: Secondary | ICD-10-CM

## 2015-02-07 DIAGNOSIS — E119 Type 2 diabetes mellitus without complications: Secondary | ICD-10-CM | POA: Diagnosis not present

## 2015-02-14 DIAGNOSIS — E1129 Type 2 diabetes mellitus with other diabetic kidney complication: Secondary | ICD-10-CM | POA: Diagnosis not present

## 2015-02-14 DIAGNOSIS — Z23 Encounter for immunization: Secondary | ICD-10-CM | POA: Diagnosis not present

## 2015-02-14 DIAGNOSIS — I10 Essential (primary) hypertension: Secondary | ICD-10-CM | POA: Diagnosis not present

## 2015-02-18 ENCOUNTER — Other Ambulatory Visit (HOSPITAL_COMMUNITY): Payer: Self-pay | Admitting: Internal Medicine

## 2015-02-18 DIAGNOSIS — Z780 Asymptomatic menopausal state: Secondary | ICD-10-CM

## 2015-02-22 ENCOUNTER — Ambulatory Visit (HOSPITAL_COMMUNITY)
Admission: RE | Admit: 2015-02-22 | Discharge: 2015-02-22 | Disposition: A | Discharge status: Home / Self Care | Payer: Medicare Other | Source: Ambulatory Visit | Attending: Internal Medicine | Admitting: Internal Medicine

## 2015-02-22 DIAGNOSIS — M8580 Other specified disorders of bone density and structure, unspecified site: Secondary | ICD-10-CM | POA: Diagnosis not present

## 2015-02-22 DIAGNOSIS — Z780 Asymptomatic menopausal state: Secondary | ICD-10-CM | POA: Insufficient documentation

## 2015-06-17 DIAGNOSIS — I10 Essential (primary) hypertension: Secondary | ICD-10-CM | POA: Diagnosis not present

## 2015-06-17 DIAGNOSIS — E1129 Type 2 diabetes mellitus with other diabetic kidney complication: Secondary | ICD-10-CM | POA: Diagnosis not present

## 2015-06-17 DIAGNOSIS — Z23 Encounter for immunization: Secondary | ICD-10-CM | POA: Diagnosis not present

## 2015-06-17 DIAGNOSIS — Z6834 Body mass index (BMI) 34.0-34.9, adult: Secondary | ICD-10-CM | POA: Diagnosis not present

## 2015-09-27 DIAGNOSIS — H2513 Age-related nuclear cataract, bilateral: Secondary | ICD-10-CM | POA: Diagnosis not present

## 2015-09-27 DIAGNOSIS — E119 Type 2 diabetes mellitus without complications: Secondary | ICD-10-CM | POA: Diagnosis not present

## 2015-10-12 DIAGNOSIS — I10 Essential (primary) hypertension: Secondary | ICD-10-CM | POA: Diagnosis not present

## 2015-10-12 DIAGNOSIS — E785 Hyperlipidemia, unspecified: Secondary | ICD-10-CM | POA: Diagnosis not present

## 2015-10-12 DIAGNOSIS — E119 Type 2 diabetes mellitus without complications: Secondary | ICD-10-CM | POA: Diagnosis not present

## 2015-10-12 DIAGNOSIS — K219 Gastro-esophageal reflux disease without esophagitis: Secondary | ICD-10-CM | POA: Diagnosis not present

## 2015-10-12 DIAGNOSIS — Z79899 Other long term (current) drug therapy: Secondary | ICD-10-CM | POA: Diagnosis not present

## 2015-10-20 DIAGNOSIS — I10 Essential (primary) hypertension: Secondary | ICD-10-CM | POA: Diagnosis not present

## 2015-10-20 DIAGNOSIS — E119 Type 2 diabetes mellitus without complications: Secondary | ICD-10-CM | POA: Diagnosis not present

## 2015-10-20 DIAGNOSIS — E785 Hyperlipidemia, unspecified: Secondary | ICD-10-CM | POA: Diagnosis not present

## 2015-11-04 DIAGNOSIS — E1142 Type 2 diabetes mellitus with diabetic polyneuropathy: Secondary | ICD-10-CM | POA: Diagnosis not present

## 2015-11-04 DIAGNOSIS — L851 Acquired keratosis [keratoderma] palmaris et plantaris: Secondary | ICD-10-CM | POA: Diagnosis not present

## 2015-11-04 DIAGNOSIS — B351 Tinea unguium: Secondary | ICD-10-CM | POA: Diagnosis not present

## 2016-01-13 DIAGNOSIS — B351 Tinea unguium: Secondary | ICD-10-CM | POA: Diagnosis not present

## 2016-01-13 DIAGNOSIS — L851 Acquired keratosis [keratoderma] palmaris et plantaris: Secondary | ICD-10-CM | POA: Diagnosis not present

## 2016-01-13 DIAGNOSIS — E1142 Type 2 diabetes mellitus with diabetic polyneuropathy: Secondary | ICD-10-CM | POA: Diagnosis not present

## 2016-01-20 ENCOUNTER — Other Ambulatory Visit (HOSPITAL_COMMUNITY): Payer: Self-pay | Admitting: Internal Medicine

## 2016-01-20 DIAGNOSIS — Z1231 Encounter for screening mammogram for malignant neoplasm of breast: Secondary | ICD-10-CM

## 2016-01-30 ENCOUNTER — Ambulatory Visit (HOSPITAL_COMMUNITY)
Admission: RE | Admit: 2016-01-30 | Discharge: 2016-01-30 | Disposition: A | Discharge status: Home / Self Care | Payer: Medicare Other | Source: Ambulatory Visit | Attending: Internal Medicine | Admitting: Internal Medicine

## 2016-01-30 DIAGNOSIS — Z1231 Encounter for screening mammogram for malignant neoplasm of breast: Secondary | ICD-10-CM | POA: Insufficient documentation

## 2016-02-13 DIAGNOSIS — E119 Type 2 diabetes mellitus without complications: Secondary | ICD-10-CM | POA: Diagnosis not present

## 2016-02-20 DIAGNOSIS — E119 Type 2 diabetes mellitus without complications: Secondary | ICD-10-CM | POA: Diagnosis not present

## 2016-02-20 DIAGNOSIS — I10 Essential (primary) hypertension: Secondary | ICD-10-CM | POA: Diagnosis not present

## 2016-03-23 DIAGNOSIS — B351 Tinea unguium: Secondary | ICD-10-CM | POA: Diagnosis not present

## 2016-03-23 DIAGNOSIS — E1142 Type 2 diabetes mellitus with diabetic polyneuropathy: Secondary | ICD-10-CM | POA: Diagnosis not present

## 2016-03-23 DIAGNOSIS — L851 Acquired keratosis [keratoderma] palmaris et plantaris: Secondary | ICD-10-CM | POA: Diagnosis not present

## 2016-06-01 DIAGNOSIS — L851 Acquired keratosis [keratoderma] palmaris et plantaris: Secondary | ICD-10-CM | POA: Diagnosis not present

## 2016-06-01 DIAGNOSIS — E1142 Type 2 diabetes mellitus with diabetic polyneuropathy: Secondary | ICD-10-CM | POA: Diagnosis not present

## 2016-06-01 DIAGNOSIS — B351 Tinea unguium: Secondary | ICD-10-CM | POA: Diagnosis not present

## 2016-08-10 DIAGNOSIS — B351 Tinea unguium: Secondary | ICD-10-CM | POA: Diagnosis not present

## 2016-08-10 DIAGNOSIS — L851 Acquired keratosis [keratoderma] palmaris et plantaris: Secondary | ICD-10-CM | POA: Diagnosis not present

## 2016-08-10 DIAGNOSIS — E1142 Type 2 diabetes mellitus with diabetic polyneuropathy: Secondary | ICD-10-CM | POA: Diagnosis not present

## 2016-08-31 DIAGNOSIS — M6740 Ganglion, unspecified site: Secondary | ICD-10-CM | POA: Diagnosis not present

## 2016-09-14 DIAGNOSIS — E119 Type 2 diabetes mellitus without complications: Secondary | ICD-10-CM | POA: Diagnosis not present

## 2016-09-14 DIAGNOSIS — Z79899 Other long term (current) drug therapy: Secondary | ICD-10-CM | POA: Diagnosis not present

## 2016-09-25 DIAGNOSIS — E1129 Type 2 diabetes mellitus with other diabetic kidney complication: Secondary | ICD-10-CM | POA: Diagnosis not present

## 2016-09-25 DIAGNOSIS — I10 Essential (primary) hypertension: Secondary | ICD-10-CM | POA: Diagnosis not present

## 2016-10-08 DIAGNOSIS — M25774 Osteophyte, right foot: Secondary | ICD-10-CM | POA: Diagnosis not present

## 2016-10-08 DIAGNOSIS — M79671 Pain in right foot: Secondary | ICD-10-CM | POA: Diagnosis not present

## 2016-12-19 ENCOUNTER — Other Ambulatory Visit (HOSPITAL_COMMUNITY): Payer: Self-pay | Admitting: Internal Medicine

## 2016-12-19 DIAGNOSIS — Z1231 Encounter for screening mammogram for malignant neoplasm of breast: Secondary | ICD-10-CM

## 2017-01-17 DIAGNOSIS — I10 Essential (primary) hypertension: Secondary | ICD-10-CM | POA: Diagnosis not present

## 2017-01-17 DIAGNOSIS — E119 Type 2 diabetes mellitus without complications: Secondary | ICD-10-CM | POA: Diagnosis not present

## 2017-01-17 DIAGNOSIS — E785 Hyperlipidemia, unspecified: Secondary | ICD-10-CM | POA: Diagnosis not present

## 2017-01-17 DIAGNOSIS — Z79899 Other long term (current) drug therapy: Secondary | ICD-10-CM | POA: Diagnosis not present

## 2017-01-17 DIAGNOSIS — K219 Gastro-esophageal reflux disease without esophagitis: Secondary | ICD-10-CM | POA: Diagnosis not present

## 2017-01-24 DIAGNOSIS — E785 Hyperlipidemia, unspecified: Secondary | ICD-10-CM | POA: Diagnosis not present

## 2017-01-24 DIAGNOSIS — I10 Essential (primary) hypertension: Secondary | ICD-10-CM | POA: Diagnosis not present

## 2017-01-24 DIAGNOSIS — E1129 Type 2 diabetes mellitus with other diabetic kidney complication: Secondary | ICD-10-CM | POA: Diagnosis not present

## 2017-02-01 ENCOUNTER — Ambulatory Visit (HOSPITAL_COMMUNITY): Admission: RE | Admit: 2017-02-01 | Payer: Medicare Other | Source: Ambulatory Visit

## 2017-12-30 DIAGNOSIS — R030 Elevated blood-pressure reading, without diagnosis of hypertension: Secondary | ICD-10-CM | POA: Diagnosis not present

## 2017-12-30 DIAGNOSIS — E1129 Type 2 diabetes mellitus with other diabetic kidney complication: Secondary | ICD-10-CM | POA: Diagnosis not present

## 2017-12-30 DIAGNOSIS — G3184 Mild cognitive impairment, so stated: Secondary | ICD-10-CM | POA: Diagnosis not present

## 2018-01-02 ENCOUNTER — Other Ambulatory Visit (HOSPITAL_COMMUNITY): Payer: Self-pay | Admitting: Internal Medicine

## 2018-01-02 DIAGNOSIS — R413 Other amnesia: Secondary | ICD-10-CM

## 2018-01-09 ENCOUNTER — Ambulatory Visit (HOSPITAL_COMMUNITY)
Admission: RE | Admit: 2018-01-09 | Discharge: 2018-01-09 | Disposition: A | Discharge status: Home / Self Care | Payer: Medicare Other | Source: Ambulatory Visit | Attending: Internal Medicine | Admitting: Internal Medicine

## 2018-01-09 DIAGNOSIS — R9089 Other abnormal findings on diagnostic imaging of central nervous system: Secondary | ICD-10-CM | POA: Insufficient documentation

## 2018-01-09 DIAGNOSIS — R413 Other amnesia: Secondary | ICD-10-CM | POA: Diagnosis not present

## 2018-03-04 DIAGNOSIS — G3184 Mild cognitive impairment, so stated: Secondary | ICD-10-CM | POA: Diagnosis not present

## 2018-03-04 DIAGNOSIS — E119 Type 2 diabetes mellitus without complications: Secondary | ICD-10-CM | POA: Diagnosis not present

## 2018-03-04 DIAGNOSIS — I10 Essential (primary) hypertension: Secondary | ICD-10-CM | POA: Diagnosis not present

## 2018-04-03 ENCOUNTER — Encounter: Payer: Self-pay | Admitting: Orthopedic Surgery

## 2018-04-03 DIAGNOSIS — K219 Gastro-esophageal reflux disease without esophagitis: Secondary | ICD-10-CM | POA: Diagnosis not present

## 2018-04-03 DIAGNOSIS — R413 Other amnesia: Secondary | ICD-10-CM | POA: Diagnosis not present

## 2018-04-03 DIAGNOSIS — E119 Type 2 diabetes mellitus without complications: Secondary | ICD-10-CM | POA: Diagnosis not present

## 2018-04-03 DIAGNOSIS — I10 Essential (primary) hypertension: Secondary | ICD-10-CM | POA: Diagnosis not present

## 2018-04-03 DIAGNOSIS — Z79899 Other long term (current) drug therapy: Secondary | ICD-10-CM | POA: Diagnosis not present

## 2018-04-03 DIAGNOSIS — E785 Hyperlipidemia, unspecified: Secondary | ICD-10-CM | POA: Diagnosis not present

## 2018-05-13 DIAGNOSIS — R419 Unspecified symptoms and signs involving cognitive functions and awareness: Secondary | ICD-10-CM | POA: Diagnosis not present

## 2018-06-03 DIAGNOSIS — Z23 Encounter for immunization: Secondary | ICD-10-CM | POA: Diagnosis not present

## 2018-06-03 DIAGNOSIS — Z111 Encounter for screening for respiratory tuberculosis: Secondary | ICD-10-CM | POA: Diagnosis not present

## 2018-06-03 DIAGNOSIS — J069 Acute upper respiratory infection, unspecified: Secondary | ICD-10-CM | POA: Diagnosis not present

## 2018-06-03 DIAGNOSIS — E1129 Type 2 diabetes mellitus with other diabetic kidney complication: Secondary | ICD-10-CM | POA: Diagnosis not present

## 2018-06-03 DIAGNOSIS — G309 Alzheimer's disease, unspecified: Secondary | ICD-10-CM | POA: Diagnosis not present

## 2018-07-04 DIAGNOSIS — M25561 Pain in right knee: Secondary | ICD-10-CM | POA: Diagnosis not present

## 2018-07-15 DIAGNOSIS — J069 Acute upper respiratory infection, unspecified: Secondary | ICD-10-CM | POA: Diagnosis not present

## 2018-07-23 ENCOUNTER — Encounter: Payer: Self-pay | Admitting: Orthopedic Surgery

## 2018-07-23 ENCOUNTER — Ambulatory Visit (INDEPENDENT_AMBULATORY_CARE_PROVIDER_SITE_OTHER): Payer: Medicare Other | Admitting: Orthopedic Surgery

## 2018-07-23 ENCOUNTER — Ambulatory Visit (INDEPENDENT_AMBULATORY_CARE_PROVIDER_SITE_OTHER): Payer: Medicare Other

## 2018-07-23 VITALS — BP 142/77 | HR 62 | Ht 62.0 in | Wt 157.0 lb

## 2018-07-23 DIAGNOSIS — M25561 Pain in right knee: Secondary | ICD-10-CM | POA: Diagnosis not present

## 2018-07-23 DIAGNOSIS — M1711 Unilateral primary osteoarthritis, right knee: Secondary | ICD-10-CM

## 2018-07-23 DIAGNOSIS — G8929 Other chronic pain: Secondary | ICD-10-CM | POA: Diagnosis not present

## 2018-07-23 MED ORDER — DICLOFENAC SODIUM 75 MG PO TBEC: 75.0000 mg | DELAYED_RELEASE_TABLET | Freq: Two times a day (BID) | ORAL | 2 refills | Status: AC

## 2018-07-23 NOTE — Progress Notes (Signed)
NEW PATIENT OFFICE VISIT  Chief Complaint  Patient presents with  . Knee Pain    Right knee pain, no injury. Referred by Dr. Ouida SillsFagan.    83 year old female with dementia comes in with her daughter-in-law (I talked to her son afterwards) complains of 1-1/2-year history of right knee pain which is progressed and worsened over the last 3 weeks although today pain is mild.  She did have severe pain about 3 weeks ago where she could not walk well and it was very painful to walk on the right lower extremity.  She was a very poor historian.  She did complain of sharp pain on the medial side of the joint which was not associated with any stiffness or mechanical symptoms   Review of Systems  Unable to perform ROS: Dementia     Past Medical History:  Diagnosis Date  . Arthritis   . Diabetes mellitus without complication (HCC)   . GERD (gastroesophageal reflux disease)   . Hyperlipemia   . Hypertension   . Wears dentures    full top-partial bottom  . Wears glasses     Past Surgical History:  Procedure Laterality Date  . ABDOMINAL HYSTERECTOMY  1974  . APPENDECTOMY    . CARPAL TUNNEL RELEASE Right 11/11/2013   Procedure: RIGHT CARPAL TUNNEL RELEASE;  Surgeon: Nicki ReaperGary R Kuzma, MD;  Location: [facility];  Service: Orthopedics;  Laterality: Right;  . COLONOSCOPY    . CYST REMOVAL LEG     rt  . DIAGNOSTIC LAPAROSCOPY  2003   BSO  . ERCP    . FLEXIBLE SIGMOIDOSCOPY    . HERNIA REPAIR     rt ing hernia  . LUMBAR LAMINECTOMY  2009  . SHOULDER ARTHROSCOPY     rt  . TONSILLECTOMY    . ULNAR NERVE TRANSPOSITION Right 11/11/2013   Procedure: DECOMPRESSION, ULNAR NERVE RIGHT ELBOW;  Surgeon: Nicki ReaperGary R Kuzma, MD;  Location: [facility];  Service: Orthopedics;  Laterality: Right;    Family History  Problem Relation Age of Onset  . Diabetes Mother   . High blood pressure Mother   . Diabetes Father   . High blood pressure Father   . High blood pressure Sister   .  Diabetes Brother   . High blood pressure Brother   . Cancer Brother   . Osteoporosis Brother   . COPD Brother    Social History   Tobacco Use  . Smoking status: Never Smoker  Substance Use Topics  . Alcohol use: No  . Drug use: No    Allergies  Allergen Reactions  . Penicillins Hives    No outpatient medications have been marked as taking for the 07/23/18 encounter (Office Visit) with Vickki HearingHarrison, Rudine Rieger E, MD.    BP (!) 142/77   Pulse 62   Ht 5\' 2"  (1.575 m)   Wt 157 lb (71.2 kg)   BMI 28.72 kg/m   Physical Exam Overall her appearance she is well kept grooming hygiene look normal.  She is oriented to person place disoriented to time her mood is pleasant her affect is pleasant as well.  She does ambulate with no assistive devices but she has a slow gait does not appear to favor right or left leg  Ortho Exam  On inspection we find tenderness over the medial joint line of the right and left knee her knee flexion is approximately 115 degrees on the right and on 120 on the left there appears to be  bilateral flexion contractures which are less than 5 degrees.  Both knees show adequate stability and strength.  Skin is normal on both lower extremities  It should be noted that although she is 5 to has a BMI of 28 her legs are very large compared to her size with thigh circumference seeming much larger than expected as is her lower leg below the knee bilaterally.  Her daughter says this is how she is always and on the legs to look skin shows no discoloration on either leg she has bilateral edema sensation is normal in both lower extremities.   MEDICAL DECISION SECTION  [facility] My independent reading of xrays:  Osteoarthritis with mild varus please see dictated report  Encounter Diagnoses  Name Primary?  . Chronic pain of right knee   . Primary osteoarthritis of right knee Yes    PLAN: (Rx., injectx, surgery, frx, mri/ct) As the  patient is 83 years old and has dementia nonoperative treatment will be the best option with stepwise treatment starting with diclofenac 75 twice daily 15-month follow-up if no improvement we can start injections and bracing the but doubt she will be a surgical candidate.  Meds ordered this encounter  Medications  . diclofenac (VOLTAREN) 75 MG EC tablet    Sig: Take 1 tablet (75 mg total) by mouth 2 (two) times daily with a meal.    Dispense:  60 tablet    Refill:  2    Fuller Canada, MD  07/23/2018 11:27 AM

## 2018-10-29 IMAGING — MR MR HEAD W/O CM
7 of 10 series · 30 of 48 positions shown · non-contrast
Comparison: None.

CLINICAL DATA: 82-year-old female with memory loss symptoms for 1
year. No known injury.

EXAM:
MRI HEAD WITHOUT CONTRAST
TECHNIQUE: Multiplanar, multiecho pulse sequences of the brain and surrounding
structures were obtained without intravenous contrast.

[Series 3: DWI · axial · 3.0mm · 0.82mm/px · z∈[-86,+56]mm · 6 of 50 slices shown (1 of 4)]
[im 1/50]
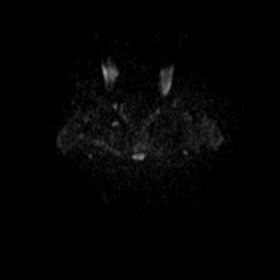
[im 10/50]
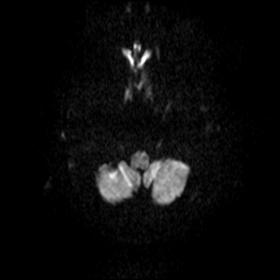
[im 20/50]
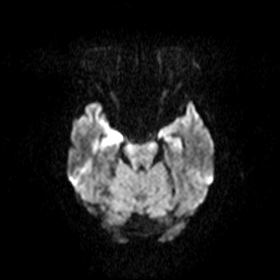
[im 30/50]
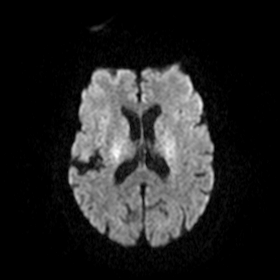
[im 40/50]
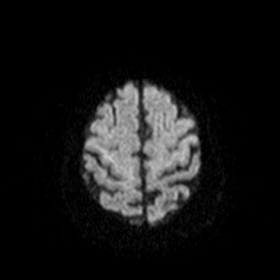
[im 50/50]
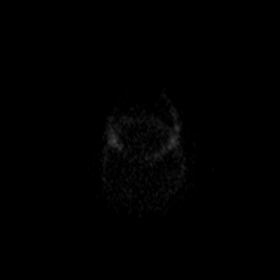

[Series 4: DWI · axial · 3.0mm · 0.82mm/px · z∈[-86,+56]mm · 6 of 50 slices shown (2 of 4)]
[im 1/50]
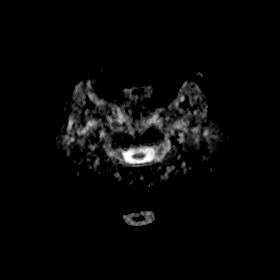
[im 10/50]
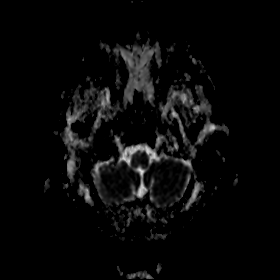
[im 20/50]
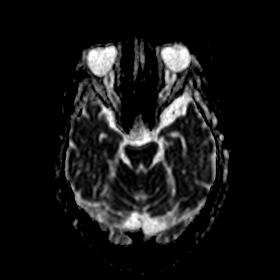
[im 30/50]
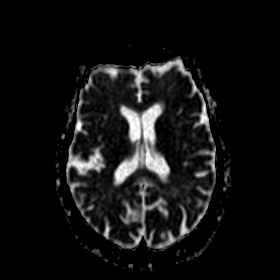
[im 40/50]
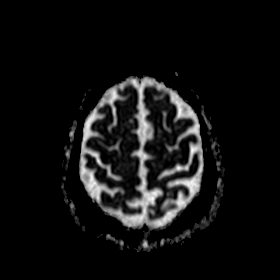
[im 50/50]
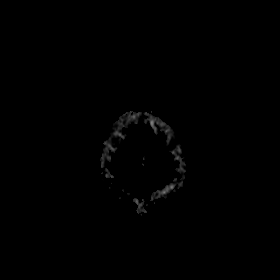

[Series 5: DWI · coronal · 5.0mm · 0.48mm/px · 4 of 34 slices shown (3 of 4)]
[im 1/34]
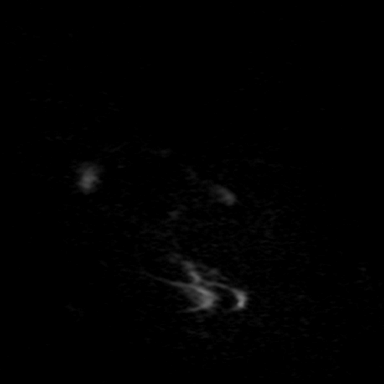
[im 12/34]
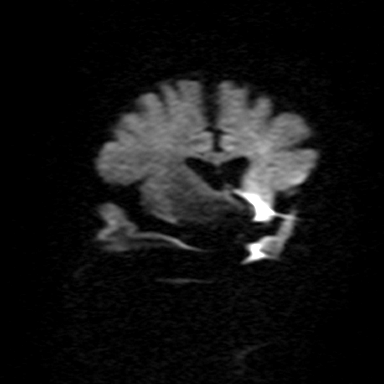
[im 23/34]
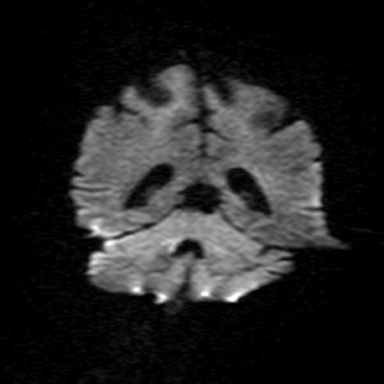
[im 34/34]
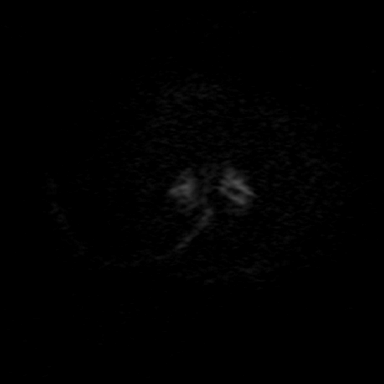

[Series 6: DWI · coronal · 5.0mm · 0.48mm/px · 4 of 33 slices shown (4 of 4)]
[im 1/33]
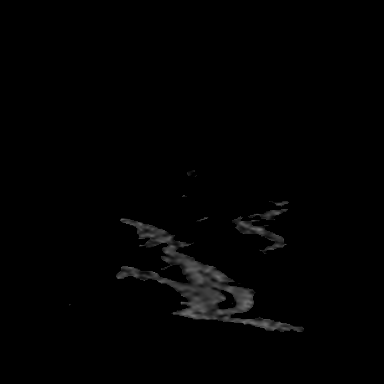
[im 11/33]
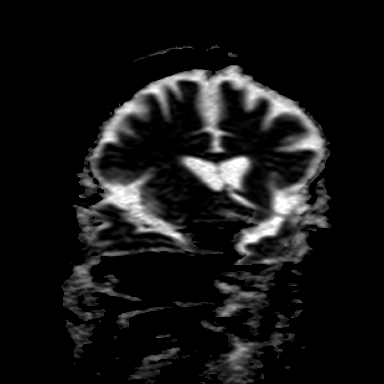
[im 22/33]
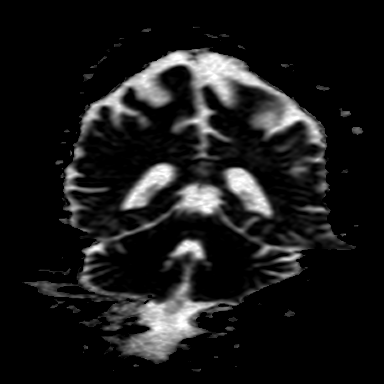
[im 33/33]
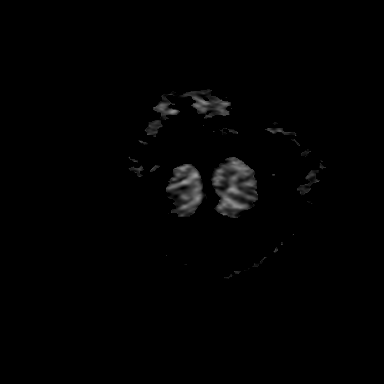

[Series 7: T2 · axial · 5.0mm · 0.60mm/px · z∈[-84,+54]mm · 3 of 23 slices shown (1 of 2)]
[im 1/23]
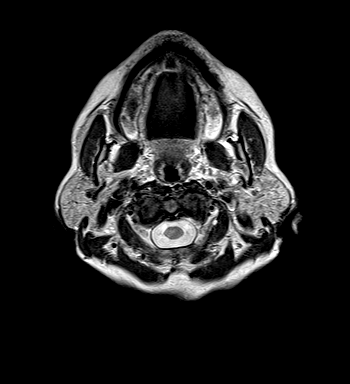
[im 12/23]
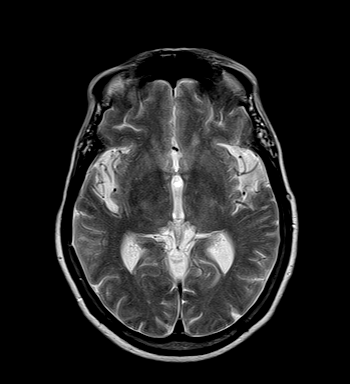
[im 23/23]
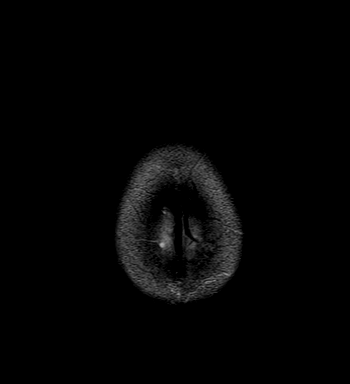

[Series 8: FLAIR · axial · 3.0mm · 0.35mm/px · z∈[-80,+53]mm · 6 of 47 slices shown]
[im 1/47]
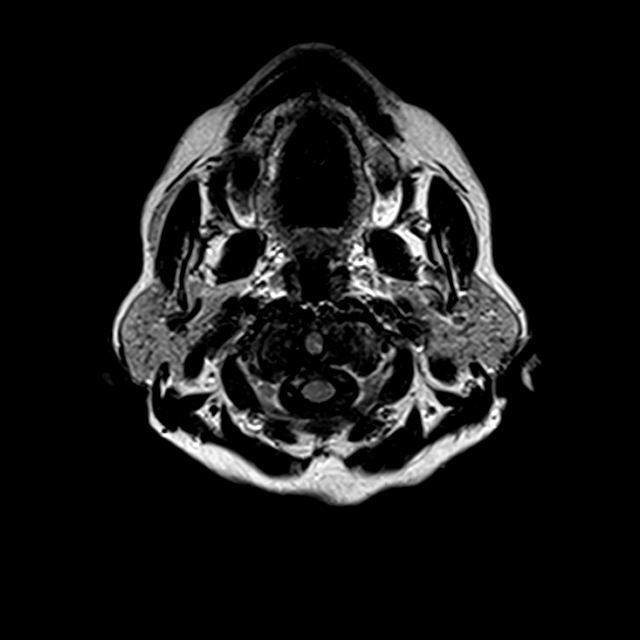
[im 10/47]
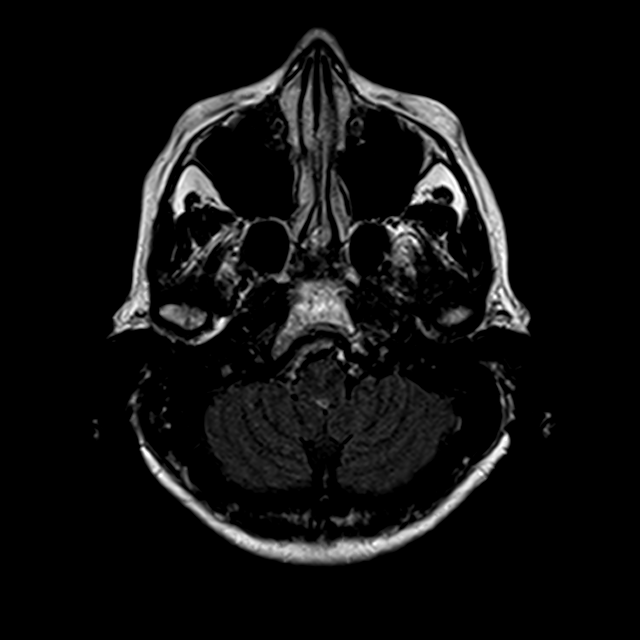
[im 19/47]
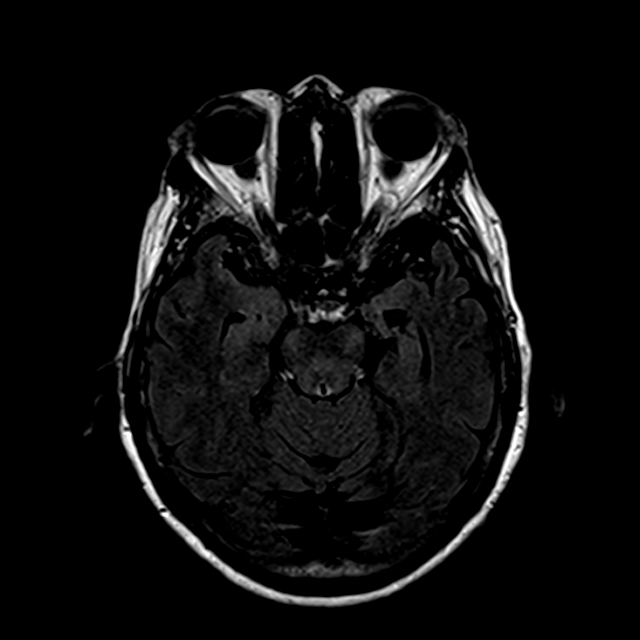
[im 28/47]
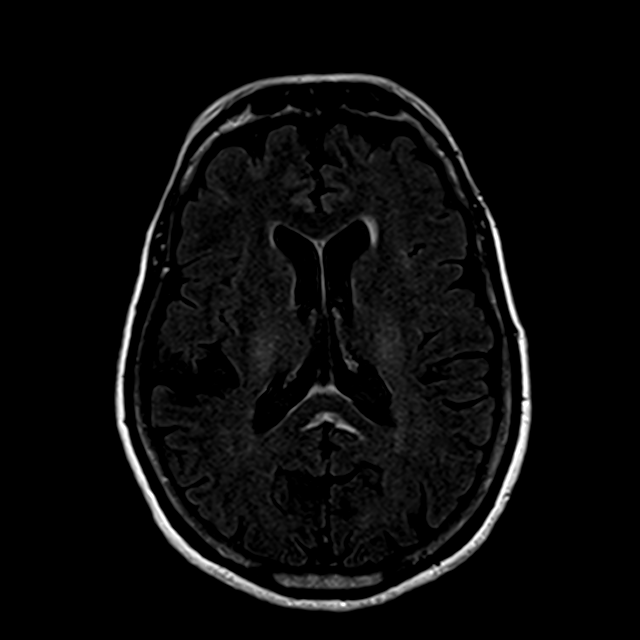
[im 37/47]
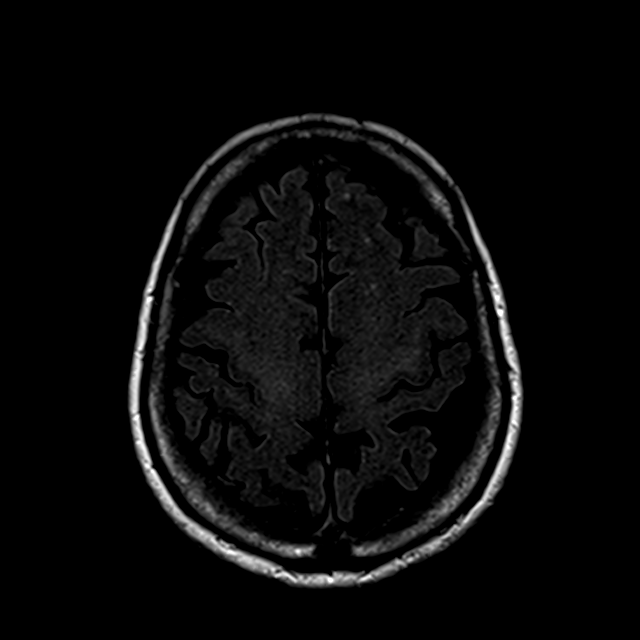
[im 47/47]
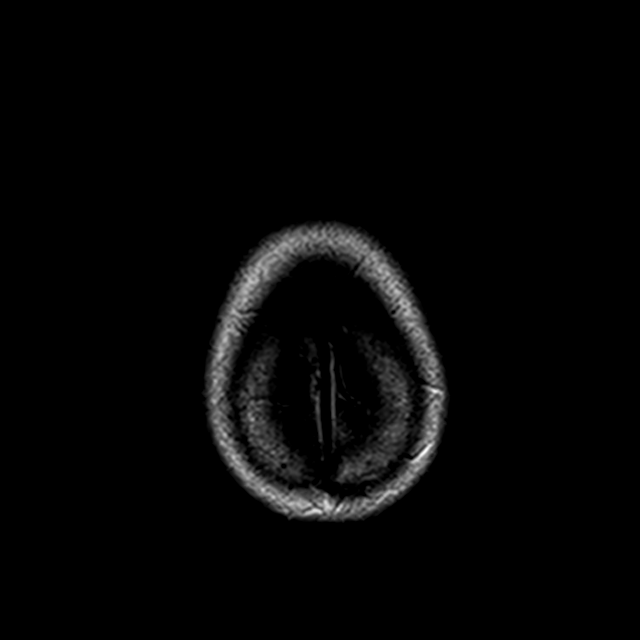

[Series 11: T2 · coronal · 5.0mm · 0.48mm/px · 1 of 28 slices shown (2 of 2)]
[im 1/28]
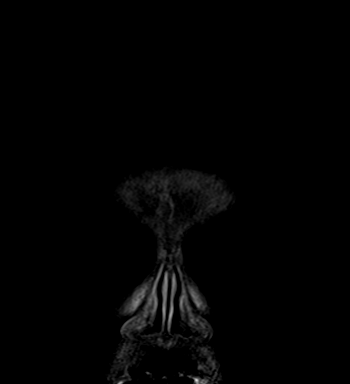

[30 of 48 positions shown; findings below may reference images not displayed]

FINDINGS: Brain: Cerebral volume is within normal limits for age. No
restricted diffusion to suggest acute infarction. No midline shift,
mass effect, evidence of mass lesion, ventriculomegaly, extra-axial
collection or acute intracranial hemorrhage. Cervicomedullary
junction and pituitary are within normal limits.

Gray and white matter signal is largely normal for age throughout
the brain. There is a chronic microhemorrhage in the right occipital
lobe on series 10, image 10. No cortical encephalomalacia
identified. However, there is asymmetric volume loss in the left
hippocampal formation compared to the right evident on series 8
images 19 and 20 and also coronal series 11, image 13.

Vascular: Major intracranial vascular flow voids are preserved.

Skull and upper cervical spine: Negative visible cervical spine.
Visualized bone marrow signal is within normal limits.

Sinuses/Orbits: Normal orbits soft tissues. Paranasal sinuses are
clear.

Other: Mild or trace right mastoid effusion. Negative nasopharynx.
The left mastoids are clear. Grossly normal visible internal
auditory structures. Scalp and face soft tissues appear negative.
IMPRESSION: 1.  No acute intracranial abnormality.
2. Nonspecific asymmetric volume loss of the left hippocampal
formation. Otherwise largely unremarkable for age noncontrast MRI
appearance of the brain.

## 2018-12-02 DIAGNOSIS — E1129 Type 2 diabetes mellitus with other diabetic kidney complication: Secondary | ICD-10-CM | POA: Diagnosis not present

## 2018-12-02 DIAGNOSIS — G300 Alzheimer's disease with early onset: Secondary | ICD-10-CM | POA: Diagnosis not present

## 2018-12-02 DIAGNOSIS — I10 Essential (primary) hypertension: Secondary | ICD-10-CM | POA: Diagnosis not present

## 2019-01-21 ENCOUNTER — Ambulatory Visit (INDEPENDENT_AMBULATORY_CARE_PROVIDER_SITE_OTHER): Payer: Medicare Other | Admitting: Orthopedic Surgery

## 2019-01-21 ENCOUNTER — Other Ambulatory Visit: Payer: Self-pay

## 2019-01-21 VITALS — BP 147/78 | HR 84 | Temp 97.7°F | Ht 62.0 in | Wt 145.0 lb

## 2019-01-21 DIAGNOSIS — M7051 Other bursitis of knee, right knee: Secondary | ICD-10-CM

## 2019-01-21 DIAGNOSIS — M1711 Unilateral primary osteoarthritis, right knee: Secondary | ICD-10-CM

## 2019-01-21 DIAGNOSIS — M25561 Pain in right knee: Secondary | ICD-10-CM | POA: Diagnosis not present

## 2019-01-21 DIAGNOSIS — G8929 Other chronic pain: Secondary | ICD-10-CM | POA: Diagnosis not present

## 2019-01-21 NOTE — Patient Instructions (Addendum)
You have received an injection of steroids into the joint. 15% of patients will have increased pain within the 24 hours postinjection.   This is transient and will go away.   We recommend that you use ice packs on the injection site for 20 minutes every 2 hours and extra strength Tylenol 2 tablets every 8 as needed until the pain resolves.  If you continue to have pain after taking the Tylenol and using the ice please call the office for further instructions.  Tylenol for pain   And you can use   These are the muscle and arthrits creams I recommend:  PLEASE READ THE PACKAGE INSTRUCTIONS BEFORE USING   Ben Gay arthritis cream  Icy hot vanishing gel  Aspercreme odor free  Myoflex Oderless pain reliever  Capzasin  Sportscreme  Max freeze

## 2019-01-21 NOTE — Progress Notes (Signed)
Progress Note   Patient ID: [name], female   DOB: 1935/01/21, 84 y.o.   MRN: [id number]   Chief Complaint  Patient presents with  . Knee Pain    Right knee pain    84 year old female with dementia has osteoarthritis of the knees she is not a surgical candidate because of the mental status presents for reevaluation of pain in her right knee complaining of soreness below the joint line and pain in the joint    Last visit: NEW PATIENT OFFICE VISIT       Chief Complaint  Patient presents with  . Knee Pain      Right knee pain, no injury. Referred by Dr. Ouida Sills.      84 year old female with dementia comes in with her daughter-in-law (I talked to her son afterwards) complains of 1-1/2-year history of right knee pain which is progressed and worsened over the last 3 weeks although today pain is mild.  She did have severe pain about 3 weeks ago where she could not walk well and it was very painful to walk on the right lower extremity.  She was a very poor historian.  She did complain of sharp pain on the medial side of the joint which was not associated with any stiffness or mechanical symptoms PLAN: (Rx., injectx, surgery, frx, mri/ct) As the patient is 84 years old and has dementia nonoperative treatment will be the best option with stepwise treatment starting with diclofenac 75 twice daily 14-month follow-up if no improvement we can start injections and bracing the but doubt she will be a surgical candidate.       Meds ordered this encounter  Medications  . diclofenac (VOLTAREN) 75 MG EC tablet      Sig: Take 1 tablet (75 mg total) by mouth 2 (two) times daily with a meal.      Dispense:  60 tablet      Refill:  2   Xrays were done at   [facility] My independent reading of xrays:  Osteoarthritis with mild varus please see dictated report  Review of Systems  Constitutional: Negative for chills, fever, malaise/fatigue and weight loss.   Musculoskeletal: Positive for joint pain.  Neurological: Negative for tingling.     Allergies  Allergen Reactions  . Penicillins Hives     BP (!) 147/78   Pulse 84   Temp 97.7 F (36.5 C)   Ht 5\' 2"  (1.575 m)   Wt 145 lb (65.8 kg)   BMI 26.52 kg/m   Physical Exam Musculoskeletal:       Legs:  Neurological:     Gait: Gait normal.      Medical decisions:   Data  Imaging:  Last visit the x-ray shows varus alignment to the knee 1 to 2 degrees joint space narrowing medially not a lot of osteophyte formation no   Encounter Diagnoses  Name Primary?  . Chronic pain of right knee Yes  . Primary osteoarthritis of right knee   . Pes anserinus bursitis of right knee     PLAN:   Inject both areas follow-up in 6 months  Use Tylenol and topical medications patient did not tolerate oral anti-inflammatories it made her have excessive urination   Procedure note right knee injection for bursitis   verbal consent was obtained to inject right knee PES BURSA  Timeout was completed to confirm the site of injection  The medications used were 40 mg of Depo-Medrol and 1% lidocaine 3  cc  Anesthesia was provided by ethyl chloride and the skin was prepped with alcohol.  After cleaning the skin with alcohol a 25-gauge needle was used to inject the right knee bursa.  There were no complications and a sterile bandage was applied   Procedure note right knee injection   verbal consent was obtained to inject right knee joint  Timeout was completed to confirm the site of injection  The medications used were 40 mg of Depo-Medrol and 1% lidocaine 3 cc  Anesthesia was provided by ethyl chloride and the skin was prepped with alcohol.  After cleaning the skin with alcohol a 20-gauge needle was used to inject the right knee joint. There were no complications. A sterile bandage was applied.     Fuller CanadaStanley Tierra Thoma, MD 01/21/2019 11:31 AM

## 2019-01-29 ENCOUNTER — Other Ambulatory Visit: Payer: Self-pay

## 2019-01-29 NOTE — Patient Outreach (Signed)
[facility]) Care Management  01/29/2019  [name] Sep 10, 1934 [id number]   Medication Adherence call to [name] [facility] Ident[facility] Verify spoke with patients daughter she explain patient is taking her medication on a regular basis and has already pick from the pharmacy for this month patient is showing past due on Losartan 100 mg,Glipizide Er 5 mg and Pioglitazone 30 mg under [employer].   Lillia Abed CPhT Pharmacy Technician [facility] Management Direct Dial [phone]  Fax [phone] [email or web address]

## 2019-03-12 DIAGNOSIS — G309 Alzheimer's disease, unspecified: Secondary | ICD-10-CM | POA: Diagnosis not present

## 2019-03-12 DIAGNOSIS — I10 Essential (primary) hypertension: Secondary | ICD-10-CM | POA: Diagnosis not present

## 2019-03-12 DIAGNOSIS — E1129 Type 2 diabetes mellitus with other diabetic kidney complication: Secondary | ICD-10-CM | POA: Diagnosis not present

## 2019-03-19 DIAGNOSIS — E1142 Type 2 diabetes mellitus with diabetic polyneuropathy: Secondary | ICD-10-CM | POA: Diagnosis not present

## 2019-03-19 DIAGNOSIS — L602 Onychogryphosis: Secondary | ICD-10-CM | POA: Diagnosis not present

## 2019-03-24 DIAGNOSIS — E1129 Type 2 diabetes mellitus with other diabetic kidney complication: Secondary | ICD-10-CM | POA: Diagnosis not present

## 2019-03-24 DIAGNOSIS — G309 Alzheimer's disease, unspecified: Secondary | ICD-10-CM | POA: Diagnosis not present

## 2019-03-24 DIAGNOSIS — Z79899 Other long term (current) drug therapy: Secondary | ICD-10-CM | POA: Diagnosis not present

## 2019-03-24 DIAGNOSIS — I10 Essential (primary) hypertension: Secondary | ICD-10-CM | POA: Diagnosis not present

## 2019-04-01 DIAGNOSIS — E1129 Type 2 diabetes mellitus with other diabetic kidney complication: Secondary | ICD-10-CM | POA: Diagnosis not present

## 2019-05-07 DIAGNOSIS — I10 Essential (primary) hypertension: Secondary | ICD-10-CM | POA: Diagnosis not present

## 2019-06-02 ENCOUNTER — Other Ambulatory Visit: Payer: Self-pay

## 2019-06-02 DIAGNOSIS — E1129 Type 2 diabetes mellitus with other diabetic kidney complication: Secondary | ICD-10-CM | POA: Diagnosis not present

## 2019-06-02 NOTE — Patient Outreach (Signed)
[facility]) Care Management  06/02/2019  SHARRY BEINING 1935/08/18 [id number]   Medication Adherence call to Mrs. Kalisa Girtman HIPPA Compliant Voice message left with a call back number. Mrs. Pequignot is showing past due on Glipizide Er 5 mg under [email or web address]   [place] Direct Dial [phone]  Fax [phone] [email or web address]

## 2019-06-12 DIAGNOSIS — N183 Chronic kidney disease, stage 3 unspecified: Secondary | ICD-10-CM | POA: Diagnosis not present

## 2019-06-12 DIAGNOSIS — I10 Essential (primary) hypertension: Secondary | ICD-10-CM | POA: Diagnosis not present

## 2019-06-12 DIAGNOSIS — E1122 Type 2 diabetes mellitus with diabetic chronic kidney disease: Secondary | ICD-10-CM | POA: Diagnosis not present

## 2019-07-03 DIAGNOSIS — E1129 Type 2 diabetes mellitus with other diabetic kidney complication: Secondary | ICD-10-CM | POA: Diagnosis not present

## 2019-07-22 ENCOUNTER — Ambulatory Visit: Payer: Medicare Other | Admitting: Orthopedic Surgery

## 2019-07-22 ENCOUNTER — Encounter: Payer: Self-pay | Admitting: Orthopedic Surgery

## 2019-07-29 DIAGNOSIS — I10 Essential (primary) hypertension: Secondary | ICD-10-CM | POA: Diagnosis not present

## 2019-09-03 DIAGNOSIS — I10 Essential (primary) hypertension: Secondary | ICD-10-CM | POA: Diagnosis not present

## 2019-09-30 DIAGNOSIS — I10 Essential (primary) hypertension: Secondary | ICD-10-CM | POA: Diagnosis not present

## 2019-10-26 DIAGNOSIS — I10 Essential (primary) hypertension: Secondary | ICD-10-CM | POA: Diagnosis not present

## 2019-11-27 DIAGNOSIS — I10 Essential (primary) hypertension: Secondary | ICD-10-CM | POA: Diagnosis not present

## 2019-12-11 DIAGNOSIS — N1831 Chronic kidney disease, stage 3a: Secondary | ICD-10-CM | POA: Diagnosis not present

## 2019-12-11 DIAGNOSIS — E1129 Type 2 diabetes mellitus with other diabetic kidney complication: Secondary | ICD-10-CM | POA: Diagnosis not present

## 2019-12-11 DIAGNOSIS — N183 Chronic kidney disease, stage 3 unspecified: Secondary | ICD-10-CM | POA: Diagnosis not present

## 2019-12-11 DIAGNOSIS — E1122 Type 2 diabetes mellitus with diabetic chronic kidney disease: Secondary | ICD-10-CM | POA: Diagnosis not present

## 2019-12-11 DIAGNOSIS — Z79899 Other long term (current) drug therapy: Secondary | ICD-10-CM | POA: Diagnosis not present

## 2019-12-11 DIAGNOSIS — I10 Essential (primary) hypertension: Secondary | ICD-10-CM | POA: Diagnosis not present

## 2019-12-29 DIAGNOSIS — I10 Essential (primary) hypertension: Secondary | ICD-10-CM | POA: Diagnosis not present

## 2020-01-27 DIAGNOSIS — E785 Hyperlipidemia, unspecified: Secondary | ICD-10-CM | POA: Diagnosis not present

## 2020-02-26 DIAGNOSIS — E1129 Type 2 diabetes mellitus with other diabetic kidney complication: Secondary | ICD-10-CM | POA: Diagnosis not present

## 2020-03-28 DIAGNOSIS — I10 Essential (primary) hypertension: Secondary | ICD-10-CM | POA: Diagnosis not present

## 2020-03-31 DIAGNOSIS — N1832 Chronic kidney disease, stage 3b: Secondary | ICD-10-CM | POA: Diagnosis not present

## 2020-03-31 DIAGNOSIS — Z79899 Other long term (current) drug therapy: Secondary | ICD-10-CM | POA: Diagnosis not present

## 2020-03-31 DIAGNOSIS — N183 Chronic kidney disease, stage 3 unspecified: Secondary | ICD-10-CM | POA: Diagnosis not present

## 2020-03-31 DIAGNOSIS — E1129 Type 2 diabetes mellitus with other diabetic kidney complication: Secondary | ICD-10-CM | POA: Diagnosis not present

## 2020-03-31 DIAGNOSIS — I10 Essential (primary) hypertension: Secondary | ICD-10-CM | POA: Diagnosis not present

## 2020-03-31 DIAGNOSIS — E1122 Type 2 diabetes mellitus with diabetic chronic kidney disease: Secondary | ICD-10-CM | POA: Diagnosis not present

## 2020-09-26 DIAGNOSIS — G309 Alzheimer's disease, unspecified: Secondary | ICD-10-CM | POA: Diagnosis not present

## 2020-09-26 DIAGNOSIS — R634 Abnormal weight loss: Secondary | ICD-10-CM | POA: Diagnosis not present

## 2020-09-27 DIAGNOSIS — I10 Essential (primary) hypertension: Secondary | ICD-10-CM | POA: Diagnosis not present

## 2020-09-30 DIAGNOSIS — E1129 Type 2 diabetes mellitus with other diabetic kidney complication: Secondary | ICD-10-CM | POA: Diagnosis not present

## 2020-09-30 DIAGNOSIS — Y847 Blood-sampling as the cause of abnormal reaction of the patient, or of later complication, without mention of misadventure at the time of the procedure: Secondary | ICD-10-CM | POA: Diagnosis not present

## 2020-09-30 DIAGNOSIS — Z79899 Other long term (current) drug therapy: Secondary | ICD-10-CM | POA: Diagnosis not present

## 2020-09-30 DIAGNOSIS — E1122 Type 2 diabetes mellitus with diabetic chronic kidney disease: Secondary | ICD-10-CM | POA: Diagnosis not present

## 2020-09-30 DIAGNOSIS — R634 Abnormal weight loss: Secondary | ICD-10-CM | POA: Diagnosis not present

## 2020-09-30 DIAGNOSIS — G309 Alzheimer's disease, unspecified: Secondary | ICD-10-CM | POA: Diagnosis not present

## 2020-10-27 DIAGNOSIS — R55 Syncope and collapse: Secondary | ICD-10-CM | POA: Diagnosis not present

## 2020-10-27 DIAGNOSIS — Z7984 Long term (current) use of oral hypoglycemic drugs: Secondary | ICD-10-CM | POA: Diagnosis not present

## 2020-10-27 DIAGNOSIS — R531 Weakness: Secondary | ICD-10-CM | POA: Diagnosis not present

## 2020-10-27 DIAGNOSIS — R7989 Other specified abnormal findings of blood chemistry: Secondary | ICD-10-CM | POA: Diagnosis not present

## 2020-10-27 DIAGNOSIS — Z79899 Other long term (current) drug therapy: Secondary | ICD-10-CM | POA: Diagnosis not present

## 2020-10-27 DIAGNOSIS — I10 Essential (primary) hypertension: Secondary | ICD-10-CM | POA: Diagnosis not present

## 2020-10-27 DIAGNOSIS — E119 Type 2 diabetes mellitus without complications: Secondary | ICD-10-CM | POA: Diagnosis not present

## 2020-10-27 DIAGNOSIS — E782 Mixed hyperlipidemia: Secondary | ICD-10-CM | POA: Diagnosis not present

## 2020-10-27 DIAGNOSIS — S0990XA Unspecified injury of head, initial encounter: Secondary | ICD-10-CM | POA: Diagnosis not present

## 2020-10-27 DIAGNOSIS — E1100 Type 2 diabetes mellitus with hyperosmolarity without nonketotic hyperglycemic-hyperosmolar coma (NKHHC): Secondary | ICD-10-CM | POA: Diagnosis not present

## 2020-10-27 DIAGNOSIS — R748 Abnormal levels of other serum enzymes: Secondary | ICD-10-CM | POA: Diagnosis not present

## 2020-10-27 DIAGNOSIS — I6523 Occlusion and stenosis of bilateral carotid arteries: Secondary | ICD-10-CM | POA: Diagnosis not present

## 2020-10-27 DIAGNOSIS — Z9181 History of falling: Secondary | ICD-10-CM | POA: Diagnosis not present

## 2020-10-27 DIAGNOSIS — E1165 Type 2 diabetes mellitus with hyperglycemia: Secondary | ICD-10-CM | POA: Diagnosis not present

## 2020-10-27 DIAGNOSIS — R918 Other nonspecific abnormal finding of lung field: Secondary | ICD-10-CM | POA: Diagnosis not present

## 2020-10-27 DIAGNOSIS — R600 Localized edema: Secondary | ICD-10-CM | POA: Diagnosis not present

## 2020-10-27 DIAGNOSIS — Z043 Encounter for examination and observation following other accident: Secondary | ICD-10-CM | POA: Diagnosis not present

## 2020-10-28 DIAGNOSIS — E782 Mixed hyperlipidemia: Secondary | ICD-10-CM | POA: Diagnosis not present

## 2020-10-28 DIAGNOSIS — R778 Other specified abnormalities of plasma proteins: Secondary | ICD-10-CM | POA: Diagnosis not present

## 2020-10-28 DIAGNOSIS — I341 Nonrheumatic mitral (valve) prolapse: Secondary | ICD-10-CM | POA: Diagnosis not present

## 2020-10-28 DIAGNOSIS — R7989 Other specified abnormal findings of blood chemistry: Secondary | ICD-10-CM | POA: Diagnosis not present

## 2020-10-28 DIAGNOSIS — R55 Syncope and collapse: Secondary | ICD-10-CM | POA: Diagnosis not present

## 2020-10-28 DIAGNOSIS — I6523 Occlusion and stenosis of bilateral carotid arteries: Secondary | ICD-10-CM | POA: Diagnosis not present

## 2020-10-28 DIAGNOSIS — I10 Essential (primary) hypertension: Secondary | ICD-10-CM | POA: Diagnosis not present

## 2020-10-28 DIAGNOSIS — R600 Localized edema: Secondary | ICD-10-CM | POA: Diagnosis not present

## 2020-10-29 DIAGNOSIS — R55 Syncope and collapse: Secondary | ICD-10-CM | POA: Diagnosis not present

## 2020-10-29 DIAGNOSIS — R778 Other specified abnormalities of plasma proteins: Secondary | ICD-10-CM | POA: Diagnosis not present

## 2020-10-29 DIAGNOSIS — E1100 Type 2 diabetes mellitus with hyperosmolarity without nonketotic hyperglycemic-hyperosmolar coma (NKHHC): Secondary | ICD-10-CM | POA: Diagnosis not present

## 2020-10-29 DIAGNOSIS — I10 Essential (primary) hypertension: Secondary | ICD-10-CM | POA: Diagnosis not present

## 2020-11-03 DIAGNOSIS — R55 Syncope and collapse: Secondary | ICD-10-CM | POA: Diagnosis not present

## 2020-11-03 DIAGNOSIS — I10 Essential (primary) hypertension: Secondary | ICD-10-CM | POA: Diagnosis not present

## 2020-11-03 DIAGNOSIS — R634 Abnormal weight loss: Secondary | ICD-10-CM | POA: Diagnosis not present

## 2020-11-25 DIAGNOSIS — I10 Essential (primary) hypertension: Secondary | ICD-10-CM | POA: Diagnosis not present

## 2020-12-30 DIAGNOSIS — R634 Abnormal weight loss: Secondary | ICD-10-CM | POA: Diagnosis not present

## 2020-12-30 DIAGNOSIS — G309 Alzheimer's disease, unspecified: Secondary | ICD-10-CM | POA: Diagnosis not present

## 2020-12-30 DIAGNOSIS — I10 Essential (primary) hypertension: Secondary | ICD-10-CM | POA: Diagnosis not present

## 2021-03-28 DIAGNOSIS — I10 Essential (primary) hypertension: Secondary | ICD-10-CM | POA: Diagnosis not present

## 2021-03-28 DIAGNOSIS — Z79899 Other long term (current) drug therapy: Secondary | ICD-10-CM | POA: Diagnosis not present

## 2021-03-28 DIAGNOSIS — R634 Abnormal weight loss: Secondary | ICD-10-CM | POA: Diagnosis not present

## 2021-03-28 DIAGNOSIS — E1129 Type 2 diabetes mellitus with other diabetic kidney complication: Secondary | ICD-10-CM | POA: Diagnosis not present

## 2021-03-28 DIAGNOSIS — G309 Alzheimer's disease, unspecified: Secondary | ICD-10-CM | POA: Diagnosis not present

## 2021-04-07 DIAGNOSIS — I10 Essential (primary) hypertension: Secondary | ICD-10-CM | POA: Diagnosis not present

## 2021-04-07 DIAGNOSIS — R7309 Other abnormal glucose: Secondary | ICD-10-CM | POA: Diagnosis not present

## 2021-04-07 DIAGNOSIS — E1129 Type 2 diabetes mellitus with other diabetic kidney complication: Secondary | ICD-10-CM | POA: Diagnosis not present

## 2021-04-07 DIAGNOSIS — N183 Chronic kidney disease, stage 3 unspecified: Secondary | ICD-10-CM | POA: Diagnosis not present

## 2021-07-23 DIAGNOSIS — R918 Other nonspecific abnormal finding of lung field: Secondary | ICD-10-CM | POA: Diagnosis not present

## 2021-07-23 DIAGNOSIS — Z79899 Other long term (current) drug therapy: Secondary | ICD-10-CM | POA: Diagnosis not present

## 2021-07-23 DIAGNOSIS — Z20822 Contact with and (suspected) exposure to covid-19: Secondary | ICD-10-CM | POA: Diagnosis not present

## 2021-07-23 DIAGNOSIS — E1169 Type 2 diabetes mellitus with other specified complication: Secondary | ICD-10-CM | POA: Diagnosis not present

## 2021-07-23 DIAGNOSIS — E785 Hyperlipidemia, unspecified: Secondary | ICD-10-CM | POA: Diagnosis not present

## 2021-07-23 DIAGNOSIS — R5383 Other fatigue: Secondary | ICD-10-CM | POA: Diagnosis not present

## 2021-07-23 DIAGNOSIS — R531 Weakness: Secondary | ICD-10-CM | POA: Diagnosis not present

## 2021-07-23 DIAGNOSIS — Z880 Allergy status to penicillin: Secondary | ICD-10-CM | POA: Diagnosis not present

## 2021-07-23 DIAGNOSIS — R195 Other fecal abnormalities: Secondary | ICD-10-CM | POA: Diagnosis not present

## 2021-07-23 DIAGNOSIS — Z7982 Long term (current) use of aspirin: Secondary | ICD-10-CM | POA: Diagnosis not present

## 2021-07-23 DIAGNOSIS — I10 Essential (primary) hypertension: Secondary | ICD-10-CM | POA: Diagnosis not present

## 2022-03-12 DIAGNOSIS — E1129 Type 2 diabetes mellitus with other diabetic kidney complication: Secondary | ICD-10-CM | POA: Diagnosis not present

## 2022-03-12 DIAGNOSIS — Z79899 Other long term (current) drug therapy: Secondary | ICD-10-CM | POA: Diagnosis not present

## 2022-03-12 DIAGNOSIS — G301 Alzheimer's disease with late onset: Secondary | ICD-10-CM | POA: Diagnosis not present

## 2022-03-12 DIAGNOSIS — N1832 Chronic kidney disease, stage 3b: Secondary | ICD-10-CM | POA: Diagnosis not present

## 2022-03-12 DIAGNOSIS — I10 Essential (primary) hypertension: Secondary | ICD-10-CM | POA: Diagnosis not present

## 2022-03-14 DIAGNOSIS — R7309 Other abnormal glucose: Secondary | ICD-10-CM | POA: Diagnosis not present

## 2022-03-14 DIAGNOSIS — G309 Alzheimer's disease, unspecified: Secondary | ICD-10-CM | POA: Diagnosis not present

## 2022-03-14 DIAGNOSIS — E1122 Type 2 diabetes mellitus with diabetic chronic kidney disease: Secondary | ICD-10-CM | POA: Diagnosis not present

## 2022-03-14 DIAGNOSIS — N1832 Chronic kidney disease, stage 3b: Secondary | ICD-10-CM | POA: Diagnosis not present

## 2022-09-25 DIAGNOSIS — E1122 Type 2 diabetes mellitus with diabetic chronic kidney disease: Secondary | ICD-10-CM | POA: Diagnosis not present

## 2022-09-25 DIAGNOSIS — I10 Essential (primary) hypertension: Secondary | ICD-10-CM | POA: Diagnosis not present

## 2022-10-16 DIAGNOSIS — I10 Essential (primary) hypertension: Secondary | ICD-10-CM | POA: Diagnosis not present

## 2022-10-16 DIAGNOSIS — E1129 Type 2 diabetes mellitus with other diabetic kidney complication: Secondary | ICD-10-CM | POA: Diagnosis not present

## 2022-10-16 DIAGNOSIS — N1832 Chronic kidney disease, stage 3b: Secondary | ICD-10-CM | POA: Diagnosis not present

## 2022-10-16 DIAGNOSIS — G301 Alzheimer's disease with late onset: Secondary | ICD-10-CM | POA: Diagnosis not present

## 2022-10-16 DIAGNOSIS — Z79899 Other long term (current) drug therapy: Secondary | ICD-10-CM | POA: Diagnosis not present

## 2022-10-26 DIAGNOSIS — I10 Essential (primary) hypertension: Secondary | ICD-10-CM | POA: Diagnosis not present

## 2022-10-26 DIAGNOSIS — R7309 Other abnormal glucose: Secondary | ICD-10-CM | POA: Diagnosis not present

## 2022-10-26 DIAGNOSIS — E1122 Type 2 diabetes mellitus with diabetic chronic kidney disease: Secondary | ICD-10-CM | POA: Diagnosis not present

## 2022-10-26 DIAGNOSIS — G309 Alzheimer's disease, unspecified: Secondary | ICD-10-CM | POA: Diagnosis not present

## 2023-04-15 DIAGNOSIS — Z743 Need for continuous supervision: Secondary | ICD-10-CM | POA: Diagnosis not present

## 2023-04-15 DIAGNOSIS — R531 Weakness: Secondary | ICD-10-CM | POA: Diagnosis not present

## 2023-04-15 DIAGNOSIS — E119 Type 2 diabetes mellitus without complications: Secondary | ICD-10-CM | POA: Diagnosis not present

## 2023-04-15 DIAGNOSIS — R82998 Other abnormal findings in urine: Secondary | ICD-10-CM | POA: Diagnosis not present

## 2023-04-15 DIAGNOSIS — I10 Essential (primary) hypertension: Secondary | ICD-10-CM | POA: Diagnosis not present

## 2023-04-15 DIAGNOSIS — R404 Transient alteration of awareness: Secondary | ICD-10-CM | POA: Diagnosis not present

## 2023-04-15 DIAGNOSIS — R410 Disorientation, unspecified: Secondary | ICD-10-CM | POA: Diagnosis not present

## 2023-04-15 DIAGNOSIS — J986 Disorders of diaphragm: Secondary | ICD-10-CM | POA: Diagnosis not present

## 2023-04-15 DIAGNOSIS — R6889 Other general symptoms and signs: Secondary | ICD-10-CM | POA: Diagnosis not present

## 2023-04-15 DIAGNOSIS — R9082 White matter disease, unspecified: Secondary | ICD-10-CM | POA: Diagnosis not present

## 2023-04-15 DIAGNOSIS — R001 Bradycardia, unspecified: Secondary | ICD-10-CM | POA: Diagnosis not present

## 2023-04-15 DIAGNOSIS — R9431 Abnormal electrocardiogram [ECG] [EKG]: Secondary | ICD-10-CM | POA: Diagnosis not present

## 2023-04-15 DIAGNOSIS — R55 Syncope and collapse: Secondary | ICD-10-CM | POA: Diagnosis not present

## 2023-12-10 DIAGNOSIS — E1129 Type 2 diabetes mellitus with other diabetic kidney complication: Secondary | ICD-10-CM | POA: Diagnosis not present

## 2023-12-10 DIAGNOSIS — G309 Alzheimer's disease, unspecified: Secondary | ICD-10-CM | POA: Diagnosis not present

## 2024-01-13 DIAGNOSIS — B351 Tinea unguium: Secondary | ICD-10-CM | POA: Diagnosis not present

## 2024-01-13 DIAGNOSIS — E1151 Type 2 diabetes mellitus with diabetic peripheral angiopathy without gangrene: Secondary | ICD-10-CM | POA: Diagnosis not present

## 2024-01-27 DIAGNOSIS — Z79899 Other long term (current) drug therapy: Secondary | ICD-10-CM | POA: Diagnosis not present

## 2024-01-27 DIAGNOSIS — E1129 Type 2 diabetes mellitus with other diabetic kidney complication: Secondary | ICD-10-CM | POA: Diagnosis not present

## 2024-01-27 DIAGNOSIS — G309 Alzheimer's disease, unspecified: Secondary | ICD-10-CM | POA: Diagnosis not present

## 2024-01-27 DIAGNOSIS — N1832 Chronic kidney disease, stage 3b: Secondary | ICD-10-CM | POA: Diagnosis not present

## 2024-02-14 DIAGNOSIS — E1129 Type 2 diabetes mellitus with other diabetic kidney complication: Secondary | ICD-10-CM | POA: Diagnosis not present

## 2024-02-14 DIAGNOSIS — I10 Essential (primary) hypertension: Secondary | ICD-10-CM | POA: Diagnosis not present

## 2024-02-14 DIAGNOSIS — N183 Chronic kidney disease, stage 3 unspecified: Secondary | ICD-10-CM | POA: Diagnosis not present

## 2024-02-14 DIAGNOSIS — G309 Alzheimer's disease, unspecified: Secondary | ICD-10-CM | POA: Diagnosis not present

## 2024-04-13 DIAGNOSIS — I739 Peripheral vascular disease, unspecified: Secondary | ICD-10-CM | POA: Diagnosis not present

## 2024-04-13 DIAGNOSIS — B351 Tinea unguium: Secondary | ICD-10-CM | POA: Diagnosis not present

## 2024-04-22 DIAGNOSIS — Z880 Allergy status to penicillin: Secondary | ICD-10-CM | POA: Diagnosis not present

## 2024-04-22 DIAGNOSIS — M7989 Other specified soft tissue disorders: Secondary | ICD-10-CM | POA: Diagnosis not present

## 2024-04-22 DIAGNOSIS — E119 Type 2 diabetes mellitus without complications: Secondary | ICD-10-CM | POA: Diagnosis not present

## 2024-04-22 DIAGNOSIS — Z79899 Other long term (current) drug therapy: Secondary | ICD-10-CM | POA: Diagnosis not present

## 2024-04-22 DIAGNOSIS — E785 Hyperlipidemia, unspecified: Secondary | ICD-10-CM | POA: Diagnosis not present

## 2024-04-22 DIAGNOSIS — M7122 Synovial cyst of popliteal space [Baker], left knee: Secondary | ICD-10-CM | POA: Diagnosis not present

## 2024-04-22 DIAGNOSIS — I10 Essential (primary) hypertension: Secondary | ICD-10-CM | POA: Diagnosis not present

## 2024-04-22 DIAGNOSIS — M79605 Pain in left leg: Secondary | ICD-10-CM | POA: Diagnosis not present

## 2024-05-10 DIAGNOSIS — Z880 Allergy status to penicillin: Secondary | ICD-10-CM | POA: Diagnosis not present

## 2024-05-10 DIAGNOSIS — S90822A Blister (nonthermal), left foot, initial encounter: Secondary | ICD-10-CM | POA: Diagnosis not present

## 2024-05-10 DIAGNOSIS — X58XXXA Exposure to other specified factors, initial encounter: Secondary | ICD-10-CM | POA: Diagnosis not present
